# Patient Record
Sex: Male | Born: 1985 | Race: White | Hispanic: No | Marital: Single | Smoking: Current every day smoker
Health system: Southern US, Community
[De-identification: ages and names within clinical notes are randomized; demographics above are authoritative.]

## PROBLEM LIST (undated history)

## (undated) DIAGNOSIS — G47419 Narcolepsy without cataplexy: Secondary | ICD-10-CM

## (undated) DIAGNOSIS — M8700 Idiopathic aseptic necrosis of unspecified bone: Secondary | ICD-10-CM

## (undated) HISTORY — PX: TOTAL HIP ARTHROPLASTY: SHX124

## (undated) HISTORY — PX: JOINT REPLACEMENT: SHX530

---

## 2013-06-01 ENCOUNTER — Other Ambulatory Visit: Payer: Self-pay | Admitting: Neurology

## 2013-06-01 DIAGNOSIS — N329 Bladder disorder, unspecified: Secondary | ICD-10-CM

## 2013-06-01 DIAGNOSIS — H532 Diplopia: Secondary | ICD-10-CM

## 2013-06-05 ENCOUNTER — Other Ambulatory Visit: Payer: Self-pay

## 2020-02-07 ENCOUNTER — Other Ambulatory Visit: Payer: Self-pay

## 2020-02-07 ENCOUNTER — Emergency Department (HOSPITAL_BASED_OUTPATIENT_CLINIC_OR_DEPARTMENT_OTHER)
Admission: EM | Admit: 2020-02-07 | Discharge: 2020-02-07 | Disposition: A | Discharge status: Home / Self Care | Payer: Self-pay | Attending: Emergency Medicine | Admitting: Emergency Medicine

## 2020-02-07 ENCOUNTER — Emergency Department (HOSPITAL_BASED_OUTPATIENT_CLINIC_OR_DEPARTMENT_OTHER): Payer: Self-pay

## 2020-02-07 ENCOUNTER — Encounter (HOSPITAL_BASED_OUTPATIENT_CLINIC_OR_DEPARTMENT_OTHER): Payer: Self-pay | Admitting: Emergency Medicine

## 2020-02-07 DIAGNOSIS — R002 Palpitations: Secondary | ICD-10-CM

## 2020-02-07 DIAGNOSIS — Z20822 Contact with and (suspected) exposure to covid-19: Secondary | ICD-10-CM | POA: Insufficient documentation

## 2020-02-07 DIAGNOSIS — J069 Acute upper respiratory infection, unspecified: Secondary | ICD-10-CM | POA: Insufficient documentation

## 2020-02-07 DIAGNOSIS — Z96643 Presence of artificial hip joint, bilateral: Secondary | ICD-10-CM | POA: Insufficient documentation

## 2020-02-07 DIAGNOSIS — F17210 Nicotine dependence, cigarettes, uncomplicated: Secondary | ICD-10-CM | POA: Insufficient documentation

## 2020-02-07 HISTORY — DX: Narcolepsy without cataplexy: G47.419

## 2020-02-07 HISTORY — DX: Idiopathic aseptic necrosis of unspecified bone: M87.00

## 2020-02-07 LAB — BASIC METABOLIC PANEL
Anion gap: 12 (ref 5–15)
BUN: 8 mg/dL (ref 6–20)
CO2: 25 mmol/L (ref 22–32)
Calcium: 9.3 mg/dL (ref 8.9–10.3)
Chloride: 100 mmol/L (ref 98–111)
Creatinine, Ser: 1.11 mg/dL (ref 0.61–1.24)
GFR calc Af Amer: >60 mL/min (ref >60)
GFR calc non Af Amer: >60 mL/min (ref >60)
Glucose, Bld: 84 mg/dL (ref 70–99)
Potassium: 4.3 mmol/L (ref 3.5–5.1)
Sodium: 137 mmol/L (ref 135–145)

## 2020-02-07 LAB — CBC WITH DIFFERENTIAL/PLATELET
Abs Immature Granulocytes: 0.02 10*3/uL (ref 0.00–0.07)
Basophils Absolute: 0.1 10*3/uL (ref 0.0–0.1)
Basophils Relative: 1 %
Eosinophils Absolute: 0 10*3/uL (ref 0.0–0.5)
Eosinophils Relative: 0 %
HCT: 44.3 % (ref 39.0–52.0)
Hemoglobin: 15 g/dL (ref 13.0–17.0)
Immature Granulocytes: 0 %
Lymphocytes Relative: 33 %
Lymphs Abs: 2.7 10*3/uL (ref 0.7–4.0)
MCH: 29.4 pg (ref 26.0–34.0)
MCHC: 33.9 g/dL (ref 30.0–36.0)
MCV: 86.7 fL (ref 80.0–100.0)
Monocytes Absolute: 0.5 10*3/uL (ref 0.1–1.0)
Monocytes Relative: 6 %
Neutro Abs: 4.9 10*3/uL (ref 1.7–7.7)
Neutrophils Relative %: 60 %
Platelets: 272 10*3/uL (ref 150–400)
RBC: 5.11 MIL/uL (ref 4.22–5.81)
RDW: 12.2 % (ref 11.5–15.5)
WBC: 8.1 10*3/uL (ref 4.0–10.5)
nRBC: 0 % (ref 0.0–0.2)

## 2020-02-07 LAB — SARS CORONAVIRUS 2 (TAT 6-24 HRS): SARS Coronavirus 2: NEGATIVE

## 2020-02-07 LAB — SARS CORONAVIRUS 2 AG (30 MIN TAT): SARS Coronavirus 2 Ag: NEGATIVE

## 2020-02-07 MED ORDER — ACETAMINOPHEN 500 MG PO TABS
1000.0000 mg | ORAL_TABLET | Freq: Once | ORAL | Status: AC
  Administered 2020-02-07: 1000 mg via ORAL
  Filled 2020-02-07: qty 2

## 2020-02-07 MED ORDER — KETOROLAC TROMETHAMINE 15 MG/ML IJ SOLN
15.0000 mg | Freq: Once | INTRAMUSCULAR | Status: AC
Start: 2020-02-07 — End: 2020-02-07
  Administered 2020-02-07: 15 mg via INTRAVENOUS
  Filled 2020-02-07: qty 1

## 2020-02-07 MED ORDER — HYDROXYZINE HCL 25 MG PO TABS
25.0000 mg | ORAL_TABLET | Freq: Once | ORAL | Status: AC
  Administered 2020-02-07: 25 mg via ORAL
  Filled 2020-02-07: qty 1

## 2020-02-07 MED ORDER — HYDROXYZINE HCL 25 MG PO TABS: 25.0000 mg | ORAL_TABLET | Freq: Four times a day (QID) | ORAL | 0 refills | Status: DC | PRN

## 2020-02-07 NOTE — ED Notes (Signed)
Water provided to pt for po challenge

## 2020-02-07 NOTE — ED Provider Notes (Addendum)
[facility] EMERGENCY DEPARTMENT Provider Note   CSN: [id number] Arrival date & time: 02/07/20  1124     History Chief Complaint  Patient presents with  . Cough    William Quinn is a 34 y.o. male.  Patient is a 34 year old male who presents with URI symptoms.  He states he has felt bad for about a week and a half with cough, runny nose, headaches and diarrhea.  He has not had any known fevers but he has had some chills.  He was having some diarrhea but this has resolved.  He does have some shortness of breath.  No vomiting.  He feels like his heart is racing at times.  He occasionally uses some over-the-counter medicines.  He last took Tylenol yesterday but no other medications today.        Past Medical History:  Diagnosis Date  . Avascular necrosis (HCC)   . Narcolepsy     There are no problems to display for this patient.   Past Surgical History:  Procedure Laterality Date  . JOINT REPLACEMENT    . TOTAL HIP ARTHROPLASTY Bilateral        No family history on file.  Social History   Tobacco Use  . Smoking status: Current Every Day Smoker    Types: Cigarettes  . Smokeless tobacco: Never Used  Substance Use Topics  . Alcohol use: Not Currently  . Drug use: Not Currently    Home Medications Prior to Admission medications   Not on File    Allergies    Patient has no known allergies.  Review of Systems   Review of Systems  Constitutional: Positive for appetite change, chills and fatigue. Negative for diaphoresis and fever.  HENT: Positive for congestion and rhinorrhea. Negative for sneezing.   Eyes: Negative.   Respiratory: Positive for cough and shortness of breath. Negative for chest tightness.   Cardiovascular: Positive for palpitations. Negative for chest pain and leg swelling.  Gastrointestinal: Positive for diarrhea and nausea. Negative for abdominal pain, blood in stool and vomiting.  Genitourinary: Negative for difficulty urinating,  flank pain, frequency and hematuria.  Musculoskeletal: Positive for myalgias. Negative for arthralgias and back pain.  Skin: Negative for rash.  Neurological: Positive for light-headedness and headaches. Negative for dizziness, speech difficulty, weakness and numbness.    Physical Exam Updated Vital Signs BP 115/82   Pulse 78   Temp 98.1 F (36.7 C) (Oral)   Resp 16   Ht 5\' 8"  (1.727 m)   Wt 68 kg   SpO2 96%   BMI 22.81 kg/m   Physical Exam Constitutional:      Appearance: He is well-developed.  HENT:     Head: Normocephalic and atraumatic.     Mouth/Throat:     Mouth: Mucous membranes are moist.  Eyes:     Pupils: Pupils are equal, round, and reactive to light.  Cardiovascular:     Rate and Rhythm: Regular rhythm. Tachycardia present.     Heart sounds: Normal heart sounds.  Pulmonary:     Effort: Pulmonary effort is normal. No respiratory distress.     Breath sounds: Normal breath sounds. No wheezing or rales.  Chest:     Chest wall: No tenderness.  Abdominal:     General: Bowel sounds are normal.     Palpations: Abdomen is soft.     Tenderness: There is no abdominal tenderness. There is no guarding or rebound.  Musculoskeletal:        General:  Normal range of motion.     Cervical back: Normal range of motion and neck supple.  Lymphadenopathy:     Cervical: No cervical adenopathy.  Skin:    General: Skin is warm and dry.     Findings: No rash.  Neurological:     Mental Status: He is alert and oriented to person, place, and time.     ED Results / Procedures / Treatments   Labs (all labs ordered are listed, but only abnormal results are displayed) Labs Reviewed  SARS CORONAVIRUS 2 AG (30 MIN TAT)  SARS CORONAVIRUS 2 (TAT 6-24 HRS)    EKG EKG Interpretation  Date/Time:  Sunday February 07 2020 11:33:31 EDT Ventricular Rate:  111 PR Interval:    QRS Duration: 90 QT Interval:  317 QTC Calculation: 431 R Axis:   89 Text Interpretation: Age not entered,  assumed to be  34 years old for purpose of ECG interpretation Sinus tachycardia Ventricular premature complex RAE, consider biatrial enlargement No old tracing to compare Confirmed by Malvin Johns [phone]) on 02/07/2020 12:04:11 PM   Radiology DG Chest Port 1 View  Result Date: 02/07/2020 CLINICAL DATA:  Cough congestion, fever and shortness of breath. EXAM: PORTABLE CHEST 1 VIEW COMPARISON:  None. FINDINGS: Cardiomediastinal silhouette is normal. Mediastinal contours appear intact. There is no evidence of focal airspace consolidation, pleural effusion or pneumothorax. Osseous structures are without acute abnormality. Soft tissues are grossly normal. IMPRESSION: No active disease. Electronically Signed   By: Fidela Salisbury M.D.   On: 02/07/2020 12:21    Procedures Procedures (including critical care time)  Medications Ordered in ED Medications  acetaminophen (TYLENOL) tablet 1,000 mg (1,000 mg Oral Given 02/07/20 1241)  ketorolac (TORADOL) 15 MG/ML injection 15 mg (15 mg Intravenous Given 02/07/20 1247)    ED Course  I have reviewed the triage vital signs and the nursing notes.  Pertinent labs & imaging results that were available during my care of the patient were reviewed by me and considered in my medical decision making (see chart for details).    MDM Rules/Calculators/A&P                      Patient is a 34 year old male who presents with viral symptoms including congestion, coughing, headaches and myalgias. He has no ongoing vomiting or diarrhea. He does not appear dehydrated. He did have some tachycardia on arrival which has resolved in the ED. He was given Tylenol and some Toradol for his headache and achiness. He was not febrile here. His heart rate at rest normalizes to the 70s and 80s. When he moves around, it goes up to the 90s and low 100s. He was concerned about his heart rate. I feel this is likely relating to his viral syndrome. I did offer to give him a small amount of IV  fluids even though we try to avoid using Covid patients. His rapid Covid was negative although he does have symptoms that are concerning for Covid. At this point he does not want to get IV fluids and wants to drink fluids by mouth. I feel like this is appropriate. He is otherwise well-appearing. He will be discharged home. He is in good condition. He was given symptomatic care instructions and return precautions.  Heartrate still mildly elevated on movement, normal at rest.  Likely associated with viral syndrome.  No hypoxia.  Will check labs, reassess.  Dr. Ralene Bathe to follow.  Final Clinical Impression(s) / ED Diagnoses Final diagnoses:  Viral URI with cough    Rx / DC Orders ED Discharge Orders    None       Rolan Bucco, MD 02/07/20 1417    Rolan Bucco, MD 02/07/20 [phone]

## 2020-02-07 NOTE — ED Notes (Signed)
O2 sat 98% while ambulating.

## 2020-02-07 NOTE — ED Notes (Signed)
SOB and CP stated when asked what brings PT in. EKG obtained and vitals within normal limits.

## 2020-02-07 NOTE — ED Triage Notes (Signed)
Pt to ED via [facility] with c/o SHOB, HA, prod cough, heart racing, sweats x 1 wk

## 2020-02-07 NOTE — ED Notes (Signed)
Pt sts he does not know what pharmacy he would use

## 2020-02-07 NOTE — ED Provider Notes (Signed)
Patient care assumed at 1500.  Patient with no significant past medical hx here for evaluation of racing heart rate, cough, headache.  He is nontoxic appearing on exam, heart rate of 80 on the monitor.  He is mildly anxious. He has no respiratory distress.  Imaging negative for pna.  Presentation is not c/w PE - pt without risk factors.  Doubt ACS, dissection.  Discussed with patient possible covid 19 infection as well as possible anxiety component.  Discussed decreasing caffeine intake, tobacco intake.  He denies alcohol or street drugs.  Discussed outpatient follow up.     Tilden Fossa, MD 02/07/20 [phone]

## 2020-08-15 ENCOUNTER — Other Ambulatory Visit: Payer: Self-pay

## 2020-08-15 ENCOUNTER — Emergency Department (HOSPITAL_BASED_OUTPATIENT_CLINIC_OR_DEPARTMENT_OTHER)
Admission: EM | Admit: 2020-08-15 | Discharge: 2020-08-16 | Disposition: A | Discharge status: Home / Self Care | Payer: Self-pay | Attending: Emergency Medicine | Admitting: Emergency Medicine

## 2020-08-15 ENCOUNTER — Encounter (HOSPITAL_BASED_OUTPATIENT_CLINIC_OR_DEPARTMENT_OTHER): Payer: Self-pay | Admitting: *Deleted

## 2020-08-15 DIAGNOSIS — Z96643 Presence of artificial hip joint, bilateral: Secondary | ICD-10-CM | POA: Insufficient documentation

## 2020-08-15 DIAGNOSIS — F17210 Nicotine dependence, cigarettes, uncomplicated: Secondary | ICD-10-CM | POA: Insufficient documentation

## 2020-08-15 DIAGNOSIS — E119 Type 2 diabetes mellitus without complications: Secondary | ICD-10-CM

## 2020-08-15 DIAGNOSIS — T50901A Poisoning by unspecified drugs, medicaments and biological substances, accidental (unintentional), initial encounter: Secondary | ICD-10-CM | POA: Insufficient documentation

## 2020-08-15 LAB — RAPID URINE DRUG SCREEN, HOSP PERFORMED
Amphetamines: NOT DETECTED
Barbiturates: NOT DETECTED
Benzodiazepines: NOT DETECTED
Cocaine: NOT DETECTED
Opiates: NOT DETECTED
Tetrahydrocannabinol: NOT DETECTED

## 2020-08-15 NOTE — ED Notes (Signed)
Upon arrival, GCS obtained, baseline vitals, placed on cont cardiac monitor, 12 lead ECG obtained, IV established

## 2020-08-15 NOTE — ED Triage Notes (Signed)
Per EMS pt had OD, rec Narcan intra nasally and IV, pt ambulated to Room 1 in [facility] ED very alert and oriented, very cooperative.

## 2020-08-16 ENCOUNTER — Encounter (HOSPITAL_BASED_OUTPATIENT_CLINIC_OR_DEPARTMENT_OTHER): Payer: Self-pay | Admitting: Emergency Medicine

## 2020-08-16 LAB — CBC WITH DIFFERENTIAL/PLATELET
Abs Immature Granulocytes: 0.17 10*3/uL — ABNORMAL HIGH (ref 0.00–0.07)
Basophils Absolute: 0.1 10*3/uL (ref 0.0–0.1)
Basophils Relative: 1 %
Eosinophils Absolute: 0.1 10*3/uL (ref 0.0–0.5)
Eosinophils Relative: 1 %
HCT: 37.9 % — ABNORMAL LOW (ref 39.0–52.0)
Hemoglobin: 12.9 g/dL — ABNORMAL LOW (ref 13.0–17.0)
Immature Granulocytes: 2 %
Lymphocytes Relative: 42 %
Lymphs Abs: 4.6 10*3/uL — ABNORMAL HIGH (ref 0.7–4.0)
MCH: 28.7 pg (ref 26.0–34.0)
MCHC: 34 g/dL (ref 30.0–36.0)
MCV: 84.4 fL (ref 80.0–100.0)
Monocytes Absolute: 0.7 10*3/uL (ref 0.1–1.0)
Monocytes Relative: 6 %
Neutro Abs: 5.4 10*3/uL (ref 1.7–7.7)
Neutrophils Relative %: 48 %
Platelets: 306 10*3/uL (ref 150–400)
RBC: 4.49 MIL/uL (ref 4.22–5.81)
RDW: 12.6 % (ref 11.5–15.5)
WBC: 11 10*3/uL — ABNORMAL HIGH (ref 4.0–10.5)
nRBC: 0 % (ref 0.0–0.2)

## 2020-08-16 LAB — COMPREHENSIVE METABOLIC PANEL
ALT: 9 U/L (ref 0–44)
AST: 23 U/L (ref 15–41)
Albumin: 3.6 g/dL (ref 3.5–5.0)
Alkaline Phosphatase: 98 U/L (ref 38–126)
Anion gap: 12 (ref 5–15)
BUN: <5 mg/dL — ABNORMAL LOW (ref 6–20)
CO2: 22 mmol/L (ref 22–32)
Calcium: 8 mg/dL — ABNORMAL LOW (ref 8.9–10.3)
Chloride: 99 mmol/L (ref 98–111)
Creatinine, Ser: 1.2 mg/dL (ref 0.61–1.24)
GFR, Estimated: >60 mL/min (ref >60)
Glucose, Bld: 220 mg/dL — ABNORMAL HIGH (ref 70–99)
Potassium: 3.1 mmol/L — ABNORMAL LOW (ref 3.5–5.1)
Sodium: 133 mmol/L — ABNORMAL LOW (ref 135–145)
Total Bilirubin: 0.6 mg/dL (ref 0.3–1.2)
Total Protein: 6.7 g/dL (ref 6.5–8.1)

## 2020-08-16 LAB — SALICYLATE LEVEL: Salicylate Lvl: <7 mg/dL — ABNORMAL LOW (ref 7.0–30.0)

## 2020-08-16 LAB — ACETAMINOPHEN LEVEL: Acetaminophen (Tylenol), Serum: <10 ug/mL — ABNORMAL LOW (ref 10–30)

## 2020-08-16 MED ORDER — POTASSIUM CHLORIDE CRYS ER 20 MEQ PO TBCR
40.0000 meq | EXTENDED_RELEASE_TABLET | Freq: Once | ORAL | Status: AC
  Administered 2020-08-16: 40 meq via ORAL
  Filled 2020-08-16: qty 2

## 2020-08-16 MED ORDER — METFORMIN HCL 500 MG PO TABS: 500.0000 mg | ORAL_TABLET | Freq: Two times a day (BID) | ORAL | 0 refills | Status: DC

## 2020-08-16 NOTE — ED Provider Notes (Signed)
[facility] EMERGENCY DEPARTMENT Provider Note   CSN: [id number] Arrival date & time: 08/15/20  2257     History Chief Complaint  Patient presents with  . OD    William Quinn is a 34 y.o. male.  The history is provided by the patient.  Drug Overdose This is a new problem. The current episode started less than 1 hour ago. The problem occurs constantly. The problem has been resolved. Pertinent negatives include no chest pain, no abdominal pain, no headaches and no shortness of breath. Nothing aggravates the symptoms. Nothing relieves the symptoms. Treatments tried: narcan  The treatment provided significant relief.  Patient reports being giving a "powder" for a headache and then being unresponsives.  EMS reported narcan caused patient to awake immediately.  No SI no HI.  States this was not intentional.  Awake without complaints at this time.      Past Medical History:  Diagnosis Date  . Avascular necrosis (HCC)   . Narcolepsy     There are no problems to display for this patient.   Past Surgical History:  Procedure Laterality Date  . JOINT REPLACEMENT    . TOTAL HIP ARTHROPLASTY Bilateral        History reviewed. No pertinent family history.  Social History   Tobacco Use  . Smoking status: Current Every Day Smoker    Types: Cigarettes  . Smokeless tobacco: Never Used  Vaping Use  . Vaping Use: Never used  Substance Use Topics  . Alcohol use: Not Currently  . Drug use: Not Currently    Home Medications Prior to Admission medications   Medication Sig Start Date End Date Taking? Authorizing Provider  hydrOXYzine (ATARAX/VISTARIL) 25 MG tablet Take 1 tablet (25 mg total) by mouth every 6 (six) hours as needed. 02/07/20   Tilden Fossa, MD    Allergies    Patient has no known allergies.  Review of Systems   Review of Systems  Constitutional: Negative for fever.  HENT: Negative for congestion.   Eyes: Negative for visual disturbance.    Respiratory: Negative for shortness of breath.   Cardiovascular: Negative for chest pain.  Gastrointestinal: Negative for abdominal pain.  Genitourinary: Negative for difficulty urinating.  Musculoskeletal: Negative for arthralgias.  Skin: Negative for rash.  Neurological: Negative for headaches.  Psychiatric/Behavioral: Negative for self-injury and suicidal ideas. The patient is not nervous/anxious.   All other systems reviewed and are negative.   Physical Exam Updated Vital Signs BP 132/84 (BP Location: Left Arm)   Pulse 98   Resp 18   Ht 5\' 8"  (1.727 m)   Wt 68 kg   SpO2 100%   BMI 22.81 kg/m   Physical Exam Vitals and nursing note reviewed.  Constitutional:      General: He is not in acute distress.    Appearance: Normal appearance.  HENT:     Head: Normocephalic and atraumatic.     Nose: Nose normal.  Eyes:     Conjunctiva/sclera: Conjunctivae normal.     Pupils: Pupils are equal, round, and reactive to light.  Cardiovascular:     Rate and Rhythm: Normal rate and regular rhythm.     Pulses: Normal pulses.     Heart sounds: Normal heart sounds.  Pulmonary:     Effort: Pulmonary effort is normal.     Breath sounds: Normal breath sounds.  Abdominal:     General: Abdomen is flat. Bowel sounds are normal.     Palpations: Abdomen is soft.  Tenderness: There is no abdominal tenderness. There is no guarding or rebound.  Musculoskeletal:        General: Normal range of motion.     Cervical back: Normal range of motion and neck supple.  Skin:    General: Skin is warm and dry.     Capillary Refill: Capillary refill takes less than 2 seconds.  Neurological:     General: No focal deficit present.     Mental Status: He is alert and oriented to person, place, and time.     Deep Tendon Reflexes: Reflexes normal.  Psychiatric:        Mood and Affect: Mood normal.        Behavior: Behavior normal.        Thought Content: Thought content does not include homicidal or  suicidal ideation.     ED Results / Procedures / Treatments   Labs (all labs ordered are listed, but only abnormal results are displayed) Results for orders placed or performed during the hospital encounter of 08/15/20  Rapid urine drug screen (hospital performed)  Result Value Ref Range   Opiates NONE DETECTED NONE DETECTED   Cocaine NONE DETECTED NONE DETECTED   Benzodiazepines NONE DETECTED NONE DETECTED   Amphetamines NONE DETECTED NONE DETECTED   Tetrahydrocannabinol NONE DETECTED NONE DETECTED   Barbiturates NONE DETECTED NONE DETECTED  CBC with Differential/Platelet  Result Value Ref Range   WBC 11.0 (H) 4.0 - 10.5 K/uL   RBC 4.49 4.22 - 5.81 MIL/uL   Hemoglobin 12.9 (L) 13.0 - 17.0 g/dL   HCT 93.7 (L) 39 - 52 %   MCV 84.4 80.0 - 100.0 fL   MCH 28.7 26.0 - 34.0 pg   MCHC 34.0 30.0 - 36.0 g/dL   RDW 90.2 40.9 - 73.5 %   Platelets 306 150 - 400 K/uL   nRBC 0.0 0.0 - 0.2 %   Neutrophils Relative % 48 %   Neutro Abs 5.4 1.7 - 7.7 K/uL   Lymphocytes Relative 42 %   Lymphs Abs 4.6 (H) 0.7 - 4.0 K/uL   Monocytes Relative 6 %   Monocytes Absolute 0.7 0.1 - 1.0 K/uL   Eosinophils Relative 1 %   Eosinophils Absolute 0.1 0 - 0 K/uL   Basophils Relative 1 %   Basophils Absolute 0.1 0 - 0 K/uL   Immature Granulocytes 2 %   Abs Immature Granulocytes 0.17 (H) 0.00 - 0.07 K/uL  Comprehensive metabolic panel  Result Value Ref Range   Sodium 133 (L) 135 - 145 mmol/L   Potassium 3.1 (L) 3.5 - 5.1 mmol/L   Chloride 99 98 - 111 mmol/L   CO2 22 22 - 32 mmol/L   Glucose, Bld 220 (H) 70 - 99 mg/dL   BUN <5 (L) 6 - 20 mg/dL   Creatinine, Ser 3.29 0.61 - 1.24 mg/dL   Calcium 8.0 (L) 8.9 - 10.3 mg/dL   Total Protein 6.7 6.5 - 8.1 g/dL   Albumin 3.6 3.5 - 5.0 g/dL   AST 23 15 - 41 U/L   ALT 9 0 - 44 U/L   Alkaline Phosphatase 98 38 - 126 U/L   Total Bilirubin 0.6 0.3 - 1.2 mg/dL   GFR, Estimated >92 >42 mL/min   Anion gap 12 5 - 15  Acetaminophen level  Result Value Ref Range    Acetaminophen (Tylenol), Serum <10 (L) 10 - 30 ug/mL  Salicylate level  Result Value Ref Range   Salicylate Lvl <7.0 (L) 7.0 -  30.0 mg/dL   No results found.  EKG EKG Interpretation  Date/Time:  Monday August 15 2020 23:01:01 EDT Ventricular Rate:  88 PR Interval:    QRS Duration: 104 QT Interval:  389 QTC Calculation: 471 R Axis:   88 Text Interpretation: Sinus rhythm Baseline wander in lead(s) V3 V4 Confirmed by Nicanor Alcon, Siennah Barrasso ([id number]) on 08/15/2020 11:54:56 PM   Radiology No results found.  Procedures Procedures (including critical care time)  Medications Ordered in ED Medications  potassium chloride SA (KLOR-CON) CR tablet 40 mEq (40 mEq Oral Given 08/16/20 0041)    ED Course  I have reviewed the triage vital signs and the nursing notes.  Pertinent labs & imaging results that were available during my care of the patient were reviewed by me and considered in my medical decision making (see chart for details).    Awake and alert.  No SI.  I suspect this was a synthetic opioid that I cannot test for given the response to narcan.  Patient was observed in the ED.  Patient is now a diabetic based on ADA classification.  I will start low dose metformin and refer to PMD for A1c and ongoing care.  I have advised the patient not to take drugs from people.  Patient verbalizes understanding and agrees to follow up.  William Quinn was evaluated in Emergency Department on 08/16/2020 for the symptoms described in the history of present illness. He was evaluated in the context of the global COVID-19 pandemic, which necessitated consideration that the patient might be at risk for infection with the SARS-CoV-2 virus that causes COVID-19. Institutional protocols and algorithms that pertain to the evaluation of patients at risk for COVID-19 are in a state of rapid change based on information released by regulatory bodies including the CDC and federal and state organizations. These policies and  algorithms were followed during the patient's care in the ED.  Final Clinical Impression(s) / ED Diagnoses  Return for intractable cough, coughing up blood,fevers >100.4 unrelieved by medication, shortness of breath, intractable vomiting, chest pain, shortness of breath, weakness,numbness, changes in speech, facial asymmetry,abdominal pain, passing out,Inability to tolerate liquids or food, cough, altered mental status or any concerns. No signs of systemic illness or infection. The patient is nontoxic-appearing on exam and vital signs are within normal limits.   I have reviewed the triage vital signs and the nursing notes. Pertinent labs &imaging results that were available during my care of the patient were reviewed by me and considered in my medical decision making (see chart for details).After history, exam, and medical workup I feel the patient has beenappropriately medically screened and is safe for discharge home. Pertinent diagnoses were discussed with the patient. Patient was given return precautions.     Mattis Featherly, MD 08/16/20 0126

## 2020-08-16 NOTE — ED Notes (Signed)
Pt tolerating po fluids. No emesis. Alert.

## 2020-08-16 NOTE — ED Notes (Signed)
Lab Staff informed that ED Providers has placed lab test orders.

## 2020-10-15 ENCOUNTER — Observation Stay (HOSPITAL_COMMUNITY): Payer: Self-pay

## 2020-10-15 ENCOUNTER — Inpatient Hospital Stay (HOSPITAL_BASED_OUTPATIENT_CLINIC_OR_DEPARTMENT_OTHER)
Admission: EM | Admit: 2020-10-15 | Discharge: 2020-10-31 | DRG: 682 | Disposition: A | Discharge status: Home / Self Care | Payer: Self-pay | Attending: Internal Medicine | Admitting: Internal Medicine

## 2020-10-15 ENCOUNTER — Encounter (HOSPITAL_BASED_OUTPATIENT_CLINIC_OR_DEPARTMENT_OTHER): Payer: Self-pay | Admitting: Emergency Medicine

## 2020-10-15 ENCOUNTER — Emergency Department (HOSPITAL_BASED_OUTPATIENT_CLINIC_OR_DEPARTMENT_OTHER): Payer: Self-pay

## 2020-10-15 DIAGNOSIS — E872 Acidosis: Secondary | ICD-10-CM | POA: Diagnosis present

## 2020-10-15 DIAGNOSIS — Z96643 Presence of artificial hip joint, bilateral: Secondary | ICD-10-CM | POA: Diagnosis present

## 2020-10-15 DIAGNOSIS — N432 Other hydrocele: Secondary | ICD-10-CM | POA: Diagnosis present

## 2020-10-15 DIAGNOSIS — E860 Dehydration: Secondary | ICD-10-CM | POA: Diagnosis present

## 2020-10-15 DIAGNOSIS — Z7984 Long term (current) use of oral hypoglycemic drugs: Secondary | ICD-10-CM

## 2020-10-15 DIAGNOSIS — E8770 Fluid overload, unspecified: Secondary | ICD-10-CM | POA: Diagnosis not present

## 2020-10-15 DIAGNOSIS — M8700 Idiopathic aseptic necrosis of unspecified bone: Secondary | ICD-10-CM

## 2020-10-15 DIAGNOSIS — T796XXA Traumatic ischemia of muscle, initial encounter: Secondary | ICD-10-CM

## 2020-10-15 DIAGNOSIS — Z20822 Contact with and (suspected) exposure to covid-19: Secondary | ICD-10-CM | POA: Diagnosis present

## 2020-10-15 DIAGNOSIS — M797 Fibromyalgia: Secondary | ICD-10-CM | POA: Diagnosis present

## 2020-10-15 DIAGNOSIS — F17210 Nicotine dependence, cigarettes, uncomplicated: Secondary | ICD-10-CM | POA: Diagnosis present

## 2020-10-15 DIAGNOSIS — Z23 Encounter for immunization: Secondary | ICD-10-CM

## 2020-10-15 DIAGNOSIS — E1165 Type 2 diabetes mellitus with hyperglycemia: Secondary | ICD-10-CM | POA: Diagnosis present

## 2020-10-15 DIAGNOSIS — R0602 Shortness of breath: Secondary | ICD-10-CM

## 2020-10-15 DIAGNOSIS — R202 Paresthesia of skin: Secondary | ICD-10-CM

## 2020-10-15 DIAGNOSIS — E8809 Other disorders of plasma-protein metabolism, not elsewhere classified: Secondary | ICD-10-CM | POA: Diagnosis present

## 2020-10-15 DIAGNOSIS — R29898 Other symptoms and signs involving the musculoskeletal system: Secondary | ICD-10-CM | POA: Diagnosis present

## 2020-10-15 DIAGNOSIS — E871 Hypo-osmolality and hyponatremia: Secondary | ICD-10-CM | POA: Diagnosis present

## 2020-10-15 DIAGNOSIS — N170 Acute kidney failure with tubular necrosis: Principal | ICD-10-CM | POA: Diagnosis present

## 2020-10-15 DIAGNOSIS — M6282 Rhabdomyolysis: Secondary | ICD-10-CM | POA: Diagnosis present

## 2020-10-15 DIAGNOSIS — R52 Pain, unspecified: Secondary | ICD-10-CM

## 2020-10-15 DIAGNOSIS — E875 Hyperkalemia: Secondary | ICD-10-CM | POA: Diagnosis present

## 2020-10-15 DIAGNOSIS — G47419 Narcolepsy without cataplexy: Secondary | ICD-10-CM | POA: Diagnosis present

## 2020-10-15 DIAGNOSIS — K5900 Constipation, unspecified: Secondary | ICD-10-CM | POA: Diagnosis present

## 2020-10-15 DIAGNOSIS — M109 Gout, unspecified: Secondary | ICD-10-CM | POA: Diagnosis present

## 2020-10-15 DIAGNOSIS — J189 Pneumonia, unspecified organism: Secondary | ICD-10-CM | POA: Diagnosis not present

## 2020-10-15 DIAGNOSIS — R509 Fever, unspecified: Secondary | ICD-10-CM

## 2020-10-15 DIAGNOSIS — E119 Type 2 diabetes mellitus without complications: Secondary | ICD-10-CM | POA: Diagnosis present

## 2020-10-15 DIAGNOSIS — N179 Acute kidney failure, unspecified: Secondary | ICD-10-CM

## 2020-10-15 DIAGNOSIS — G894 Chronic pain syndrome: Secondary | ICD-10-CM | POA: Diagnosis present

## 2020-10-15 DIAGNOSIS — R0600 Dyspnea, unspecified: Secondary | ICD-10-CM

## 2020-10-15 LAB — RESP PANEL BY RT-PCR (FLU A&B, COVID) ARPGX2
Influenza A by PCR: NEGATIVE
Influenza B by PCR: NEGATIVE
SARS Coronavirus 2 by RT PCR: NEGATIVE

## 2020-10-15 LAB — GLUCOSE, CAPILLARY
Glucose-Capillary: 78 mg/dL (ref 70–99)
Glucose-Capillary: 103 mg/dL — ABNORMAL HIGH (ref 70–99)

## 2020-10-15 LAB — CREATININE, SERUM
Creatinine, Ser: 4.95 mg/dL — ABNORMAL HIGH (ref 0.61–1.24)
GFR, Estimated: 15 mL/min — ABNORMAL LOW

## 2020-10-15 LAB — CBC
HCT: 34.1 % — ABNORMAL LOW (ref 39.0–52.0)
HCT: 31.7 % — ABNORMAL LOW (ref 39.0–52.0)
Hemoglobin: 12.1 g/dL — ABNORMAL LOW (ref 13.0–17.0)
Hemoglobin: 10.9 g/dL — ABNORMAL LOW (ref 13.0–17.0)
MCH: 30 pg (ref 26.0–34.0)
MCH: 29 pg (ref 26.0–34.0)
MCHC: 35.5 g/dL (ref 30.0–36.0)
MCHC: 34.4 g/dL (ref 30.0–36.0)
MCV: 84.4 fL (ref 80.0–100.0)
MCV: 84.3 fL (ref 80.0–100.0)
Platelets: 171 10*3/uL (ref 150–400)
Platelets: 159 10*3/uL (ref 150–400)
RBC: 4.04 MIL/uL — ABNORMAL LOW (ref 4.22–5.81)
RBC: 3.76 MIL/uL — ABNORMAL LOW (ref 4.22–5.81)
RDW: 13.2 % (ref 11.5–15.5)
RDW: 13.2 % (ref 11.5–15.5)
WBC: 13.2 10*3/uL — ABNORMAL HIGH (ref 4.0–10.5)
WBC: 9.4 10*3/uL (ref 4.0–10.5)
nRBC: 0 % (ref 0.0–0.2)
nRBC: 0 % (ref 0.0–0.2)

## 2020-10-15 LAB — CK: Total CK: >50000 U/L — ABNORMAL HIGH (ref 49–397)

## 2020-10-15 LAB — LIPID PANEL
Cholesterol: 127 mg/dL (ref 0–200)
HDL: 29 mg/dL — ABNORMAL LOW
LDL Cholesterol: 66 mg/dL (ref 0–99)
Total CHOL/HDL Ratio: 4.4 ratio
Triglycerides: 161 mg/dL — ABNORMAL HIGH
VLDL: 32 mg/dL (ref 0–40)

## 2020-10-15 LAB — BASIC METABOLIC PANEL
Anion gap: 9 (ref 5–15)
BUN: 50 mg/dL — ABNORMAL HIGH (ref 6–20)
CO2: 23 mmol/L (ref 22–32)
Calcium: 7.6 mg/dL — ABNORMAL LOW (ref 8.9–10.3)
Chloride: 91 mmol/L — ABNORMAL LOW (ref 98–111)
Creatinine, Ser: 3.86 mg/dL — ABNORMAL HIGH (ref 0.61–1.24)
GFR, Estimated: 20 mL/min — ABNORMAL LOW (ref >60)
Glucose, Bld: 99 mg/dL (ref 70–99)
Potassium: 4.1 mmol/L (ref 3.5–5.1)
Sodium: 123 mmol/L — ABNORMAL LOW (ref 135–145)

## 2020-10-15 MED ORDER — TRAMADOL HCL 50 MG PO TABS
50.0000 mg | ORAL_TABLET | Freq: Four times a day (QID) | ORAL | Status: DC | PRN
  Administered 2020-10-15 – 2020-10-31 (×33): 50 mg via ORAL
  Filled 2020-10-15 (×34): qty 1

## 2020-10-15 MED ORDER — PNEUMOCOCCAL VAC POLYVALENT 25 MCG/0.5ML IJ INJ
0.5000 mL | INJECTION | INTRAMUSCULAR | Status: DC
  Filled 2020-10-15: qty 0.5

## 2020-10-15 MED ORDER — SODIUM CHLORIDE 0.9 % IV SOLN: INTRAVENOUS | Status: DC

## 2020-10-15 MED ORDER — HEPARIN SODIUM (PORCINE) 5000 UNIT/ML IJ SOLN
5000.0000 [IU] | Freq: Three times a day (TID) | INTRAMUSCULAR | Status: DC
  Administered 2020-10-15 – 2020-10-31 (×43): 5000 [IU] via SUBCUTANEOUS
  Filled 2020-10-15 (×43): qty 1

## 2020-10-15 MED ORDER — INSULIN ASPART 100 UNIT/ML ~~LOC~~ SOLN
0.0000 [IU] | SUBCUTANEOUS | Status: DC
  Administered 2020-10-16 (×2): 2 [IU] via SUBCUTANEOUS
  Administered 2020-10-16: 13:00:00 3 [IU] via SUBCUTANEOUS

## 2020-10-15 MED ORDER — METHYLPREDNISOLONE SODIUM SUCC 125 MG IJ SOLR
60.0000 mg | Freq: Two times a day (BID) | INTRAMUSCULAR | Status: DC
  Administered 2020-10-15 – 2020-10-16 (×2): 60 mg via INTRAVENOUS
  Filled 2020-10-15 (×2): qty 2

## 2020-10-15 MED ORDER — SODIUM CHLORIDE 0.9 % IV SOLN
1000.0000 mL | INTRAVENOUS | Status: DC
  Administered 2020-10-15: 1000 mL via INTRAVENOUS

## 2020-10-15 MED ORDER — INFLUENZA VAC SPLIT QUAD 0.5 ML IM SUSY: 0.5000 mL | PREFILLED_SYRINGE | INTRAMUSCULAR | Status: DC

## 2020-10-15 MED ORDER — SODIUM CHLORIDE 0.9 % IV BOLUS (SEPSIS)
500.0000 mL | Freq: Once | INTRAVENOUS | Status: AC
  Administered 2020-10-15: 500 mL via INTRAVENOUS

## 2020-10-15 MED ORDER — ACETAMINOPHEN 325 MG PO TABS
650.0000 mg | ORAL_TABLET | Freq: Four times a day (QID) | ORAL | Status: DC | PRN
  Administered 2020-10-15 – 2020-10-28 (×13): 650 mg via ORAL
  Filled 2020-10-15 (×13): qty 2

## 2020-10-15 MED ORDER — SODIUM CHLORIDE 0.9 % IV BOLUS
1000.0000 mL | Freq: Once | INTRAVENOUS | Status: AC
  Administered 2020-10-15: 19:00:00 1000 mL via INTRAVENOUS

## 2020-10-15 NOTE — ED Notes (Signed)
82ml on bladder scan

## 2020-10-15 NOTE — Progress Notes (Signed)
Critical lab value CK>50,000 reported to Dr. Carolyne Littles.

## 2020-10-15 NOTE — ED Notes (Signed)
Patient transported to Ultrasound 

## 2020-10-15 NOTE — H&P (Signed)
[facility] History and Physical  Ibrohim Simmers IFO:[id number] DOB: June 27, 1986 DOA: 10/15/2020  Referring physician:  PCP: Patient, No Pcp Per   Chief Complaint: MCHP  HPI: Stclair Szymborski is a 34 yo WM  PMHx Avascular necrosis, Narcolepsy, DM type II  presents to the ED for evaluation of numbness of his right leg.  Patient states the symptoms started yesterday.  He feels like his whole right leg is having pins-and-needles.  Its not on one side versus the other.  Patient also has some mild discomfort with it but is not really having any significant pain.  He denies any back pain.  Patient states his foot also feels cold  Patient's numbness is in a stocking type pattern.  Argues against lumbar radiculopathy however the unilateral nature is atypical for some type of metabolic cause for his numbness.  Did check laboratory test to assess for any electrolyte abnormalities and the patient does have evidence of acute kidney injury with elevated BUN and creatinine.  Patient is also hyponatremic and this is all new from 2 months ago.  I have ordered a renal ultrasound.  I will also add on a CK.  Patient states that yesterday he fell on a tile floor fairly hard.  Then began to experience weakness and numbness in his RIGHT lower extremity.    Review of Systems:  Covid vaccination; vaccinated  Constitutional:  No weight loss, night sweats, Fevers, chills, fatigue.  HEENT:  No headaches, Difficulty swallowing,Tooth/dental problems,Sore throat,  No sneezing, itching, ear ache, nasal congestion, post nasal drip,  Cardio-vascular:  No chest pain, Orthopnea, PND, swelling in lower extremities, anasarca, dizziness, palpitations  GI:  No heartburn, indigestion, abdominal pain, nausea, vomiting, diarrhea, change in bowel habits, loss of appetite  Resp:  No shortness of breath with exertion or at rest. No excess mucus, no productive cough, No non-productive cough, No coughing up of blood.No change in  color of mucus.No wheezing.No chest wall deformity  Skin:  Positive bruise right L5-S1 area.  GU:  no dysuria, change in color of urine, no urgency or frequency. No flank pain.  Musculoskeletal:  No joint pain or swelling. No decreased range of motion. No back pain.  Psych:  No change in mood or affect. No depression or anxiety. No memory loss. Neurologic; Positive RIGHT lower extremity weakness, positive RIGHT lower extremity decreased sensation  Past Medical History:  Diagnosis Date  . Avascular necrosis (HCC)   . Narcolepsy    Past Surgical History:  Procedure Laterality Date  . JOINT REPLACEMENT    . TOTAL HIP ARTHROPLASTY Bilateral    Social History:  reports that he has been smoking cigarettes. He has never used smokeless tobacco. He reports previous alcohol use. He reports previous drug use.  No Known Allergies  FHx negative HTN, negative DM, negative stroke, negative cancer  Prior to Admission medications   Medication Sig Start Date End Date Taking? Authorizing Provider  hydrOXYzine (ATARAX/VISTARIL) 25 MG tablet Take 1 tablet (25 mg total) by mouth every 6 (six) hours as needed. 02/07/20   Tilden Fossa, MD  metFORMIN (GLUCOPHAGE) 500 MG tablet Take 1 tablet (500 mg total) by mouth 2 (two) times daily with a meal. 08/16/20   Palumbo, April, MD     Consultants:    Procedures/Significant Events:    I have personally reviewed and interpreted all radiology studies and my findings are as above.   VENTILATOR SETTINGS:    Cultures 12/11 negative SARS coronavirus 12/11 influenza A/B negative  Antimicrobials:  Devices    LINES / TUBES:      Continuous Infusions: . sodium chloride 1,000 mL (10/15/20 1533)  . sodium chloride    . sodium chloride      Physical Exam: Vitals:   10/15/20 1500 10/15/20 1557 10/15/20 1600 10/15/20 1716  BP: 124/85  126/80 130/70  Pulse: 73  75 70  Resp: 18  16 18   Temp:  98.9 F (37.2 C)  99.1 F (37.3 C)   TempSrc:  Oral  Oral  SpO2: 100%  98% 99%  Weight:      Height:        Wt Readings from Last 3 Encounters:  10/15/20 65.8 kg  08/15/20 68 kg  02/07/20 68 kg    General: A/O x4, No acute respiratory distress Eyes: negative scleral hemorrhage, negative anisocoria, negative icterus ENT: Negative Runny nose, negative gingival bleeding, Neck:  Negative scars, masses, torticollis, lymphadenopathy, JVD Lungs: Clear to auscultation bilaterally without wheezes or crackles Cardiovascular: Regular rate and rhythm without murmur gallop or rub normal S1 and S2 Abdomen: negative abdominal pain, nondistended, positive soft, bowel sounds, no rebound, no ascites, no appreciable mass Extremities: No significant cyanosis, clubbing, or edema bilateral lower extremities Skin: Bruise over the RIGHT L4-S1 area. Psychiatric:  Negative depression, negative anxiety, negative fatigue, negative mania  Central nervous system:  Cranial nerves II through XII intact, tongue/uvula midline, all extremities muscle strength 5/5 except RIGHT lower extremity.  RIGHT lower extremity strength 4/5, sensation intact throughout, except RIGHT lower extremity.  RIGHT lower extremity decreased sensation to pinprick and soft touch from hip to toes.  negative dysarthria, negative expressive aphasia, negative receptive aphasia.        Labs on Admission:  Basic Metabolic Panel: Recent Labs  Lab 10/15/20 1325  NA 123*  K 4.1  CL 91*  CO2 23  GLUCOSE 99  BUN 50*  CREATININE 3.86*  CALCIUM 7.6*   Liver Function Tests: No results for input(s): AST, ALT, ALKPHOS, BILITOT, PROT, ALBUMIN in the last 168 hours. No results for input(s): LIPASE, AMYLASE in the last 168 hours. No results for input(s): AMMONIA in the last 168 hours. CBC: Recent Labs  Lab 10/15/20 1325  WBC 13.2*  HGB 12.1*  HCT 34.1*  MCV 84.4  PLT 171   Cardiac Enzymes: Recent Labs  Lab 10/15/20 1325  CKTOTAL >50,000*    BNP (last 3 results) No  results for input(s): BNP in the last 8760 hours.  ProBNP (last 3 results) No results for input(s): PROBNP in the last 8760 hours.  CBG: No results for input(s): GLUCAP in the last 168 hours.  Radiological Exams on Admission: 14/11/21 Renal  Result Date: 10/15/2020 CLINICAL DATA:  Acute renal failure. EXAM: RENAL / URINARY TRACT ULTRASOUND COMPLETE COMPARISON:  None. FINDINGS: Right Kidney: Renal measurements: 11.3 x 4.5 x 5.7 cm = volume: 151 mL. Echogenicity appears mildly increased. No mass or hydronephrosis visualized. Left Kidney: Renal measurements: 12.0 x 5.4 x 4.8 cm = volume: 164 mL. Echogenicity appears mildly increased. No mass or hydronephrosis visualized. Bladder: Appears normal for degree of bladder distention. Other: Incidentally noted moderate amount of sludge in the gallbladder. No gallbladder wall thickening. IMPRESSION: 1. Mild increase in echogenicity of the bilateral kidneys as can be seen in medical renal disease. No other acute finding. 2. Incidentally noted moderate amount of sludge in the gallbladder. No gallbladder wall thickening. Electronically Signed   By: 14/09/2020 M.D.   On: 10/15/2020 14:46    EKG: Independently reviewed.  Assessment/Plan Active Problems:   AKI (acute kidney injury) (HCC)   Lower extremity weakness   Hyponatremia   Narcolepsy   Avascular necrosis (HCC)   Type 2 diabetes mellitus without complication (HCC)   Rhabdomyolysis  Acute RIGHT lower extremity weakness/numbness -Patient with pain to palpation over L4-S1 RIGHT spinal process, appears to be hematoma present. -Patient appears to have true weakness/numbness on neuro exam, not from pain from hematoma. -CT L-spine pending.  Patient informed may require MRI -Empirically start Solu-Medrol 60 mg BID   Hx of avascular necrosis -Unsure of cause will interview patient in a.m. concerning cause. -Inpatients medical records also mention of bilateral THA.  DM type II without  complication -Patient on Metformin at home.  (Held) -Hemoglobin A1c/lipid panel pending -Moderate SSI  Hyponatremia -Most likely secondary to rhabdomyolysis/dehydration.  Should correct with aggressive fluid hydration. -See rhabdomyolysis  Rhabdomyolysis -CK from [facility] P just reads> 50,000 -CK pending we will trend -Bolus 1 L normal saline----> normal saline 147ml/hr  AKI -Most likely secondary to rhabdomyolysis -Hold all nephrotoxic medication -Strict in and out -Daily weight Lab Results  Component Value Date   CREATININE 3.86 (H) 10/15/2020   CREATININE 1.20 08/15/2020   CREATININE 1.11 02/07/2020  ]   Code Status: Full (DVT Prophylaxis: Subcu heparin Family Communication:   Status is: Inpatient    Dispo: The patient is from: Home              Anticipated d/c is to: Home              Anticipated d/c date is: 12/15              Patient currently unstable     Data Reviewed: Care during the described time interval was provided by me .  I have reviewed this patient's available data, including medical history, events of note, physical examination, and all test results as part of my evaluation.   The patient is critically ill with multiple organ systems failure and requires high complexity decision making for assessment and support, frequent evaluation and titration of therapies, application of advanced monitoring technologies and extensive interpretation of multiple databases. Critical Care Time devoted to patient care services described in this note  Time spent: 70 minutes   Dorothey Oetken, Roselind Messier [facility] Pager [phone]

## 2020-10-15 NOTE — ED Notes (Signed)
Critical lab value CK > 50,000 reported to [employer].

## 2020-10-15 NOTE — Progress Notes (Signed)
RN admit note: Received pt via stretcher via carelink. Oriented to room, verbalized understanding. See flowsheet for assessment. MD notified of admission. Placed on highfalls risk and advised not to get out of bed. Reports pain, MD notified. No distress noted, will continue to monitor pt. Closely. Rudene Re

## 2020-10-15 NOTE — ED Triage Notes (Addendum)
Pt arrived via EMS c/o R leg numbness and pain since yesterday at 9am. EMS reports +pulse, motor, sensation. He is walking with a limp. States it feels like "pins and needles"

## 2020-10-15 NOTE — ED Provider Notes (Signed)
[facility] EMERGENCY DEPARTMENT Provider Note   CSN: [id number] Arrival date & time: 10/15/20  1137     History Chief Complaint  Patient presents with  . Leg Pain  . Numbness    William Quinn is a 34 y.o. male.  HPI   Patient presents to the ED for evaluation of numbness of his right leg.  Patient states the symptoms started yesterday.  He feels like his whole right leg is having pins-and-needles.  Its not on one side versus the other.  Patient also has some mild discomfort with it but is not really having any significant pain.  He denies any back pain.  Patient states his foot also feels cold.  Past Medical History:  Diagnosis Date  . Avascular necrosis (HCC)   . Narcolepsy     There are no problems to display for this patient.   Past Surgical History:  Procedure Laterality Date  . JOINT REPLACEMENT    . TOTAL HIP ARTHROPLASTY Bilateral        No family history on file.  Social History   Tobacco Use  . Smoking status: Current Every Day Smoker    Types: Cigarettes  . Smokeless tobacco: Never Used  Vaping Use  . Vaping Use: Never used  Substance Use Topics  . Alcohol use: Not Currently  . Drug use: Not Currently    Home Medications Prior to Admission medications   Medication Sig Start Date End Date Taking? Authorizing Provider  hydrOXYzine (ATARAX/VISTARIL) 25 MG tablet Take 1 tablet (25 mg total) by mouth every 6 (six) hours as needed. 02/07/20   Tilden Fossa, MD  metFORMIN (GLUCOPHAGE) 500 MG tablet Take 1 tablet (500 mg total) by mouth 2 (two) times daily with a meal. 08/16/20   Palumbo, April, MD    Allergies    Patient has no known allergies.  Review of Systems   Review of Systems  All other systems reviewed and are negative.   Physical Exam Updated Vital Signs BP 122/86   Pulse 72   Temp 98.6 F (37 C) (Oral)   Resp 15   Ht 1.727 m (5\' 8" )   Wt 65.8 kg   SpO2 100%   BMI 22.05 kg/m   Physical Exam Vitals and nursing  note reviewed.  Constitutional:      General: He is not in acute distress.    Appearance: He is well-developed and well-nourished.  HENT:     Head: Normocephalic and atraumatic.     Right Ear: External ear normal.     Left Ear: External ear normal.  Eyes:     General: No scleral icterus.       Right eye: No discharge.        Left eye: No discharge.     Conjunctiva/sclera: Conjunctivae normal.  Neck:     Trachea: No tracheal deviation.  Cardiovascular:     Rate and Rhythm: Normal rate and regular rhythm.     Pulses: Intact distal pulses.     Comments: Strong dorsalis pedis pulse right foot, Pulmonary:     Effort: Pulmonary effort is normal. No respiratory distress.     Breath sounds: Normal breath sounds. No stridor. No wheezing or rales.  Abdominal:     General: Bowel sounds are normal. There is no distension.     Palpations: Abdomen is soft.     Tenderness: There is no abdominal tenderness. There is no guarding or rebound.  Musculoskeletal:  General: No tenderness or edema.     Cervical back: Neck supple.     Comments: Extremities warm and well perfused, no cyanosis, no rash  Skin:    General: Skin is warm and dry.     Findings: No rash.  Neurological:     Mental Status: He is alert.     Cranial Nerves: No cranial nerve deficit (no facial droop, extraocular movements intact, no slurred speech).     Sensory: No sensory deficit.     Motor: No abnormal muscle tone or seizure activity.     Coordination: Coordination normal.     Deep Tendon Reflexes: Strength normal.     Comments: Equal grip strength bilaterally, no facial droop, normal sensation bilateral upper extremities; decreased sensation to light touch of the entire leg in a stocking type pattern  Psychiatric:        Mood and Affect: Mood and affect normal.     ED Results / Procedures / Treatments   Labs (all labs ordered are listed, but only abnormal results are displayed) Labs Reviewed  CBC - Abnormal;  Notable for the following components:      Result Value   WBC 13.2 (*)    RBC 4.04 (*)    Hemoglobin 12.1 (*)    HCT 34.1 (*)    All other components within normal limits  BASIC METABOLIC PANEL - Abnormal; Notable for the following components:   Sodium 123 (*)    Chloride 91 (*)    BUN 50 (*)    Creatinine, Ser 3.86 (*)    Calcium 7.6 (*)    GFR, Estimated 20 (*)    All other components within normal limits  RESP PANEL BY RT-PCR (FLU A&B, COVID) ARPGX2  CK    EKG None  Radiology No results found.  Procedures Procedures (including critical care time)  Medications Ordered in ED Medications  sodium chloride 0.9 % bolus 500 mL (has no administration in time range)    Followed by  0.9 %  sodium chloride infusion (has no administration in time range)    ED Course  I have reviewed the triage vital signs and the nursing notes.  Pertinent labs & imaging results that were available during my care of the patient were reviewed by me and considered in my medical decision making (see chart for details).  Clinical Course as of 10/15/20 1410  Sat Oct 15, 2020  1355 Labs notable for hyponatremia and elevated BUN and creatinine. [JK]  1355 Leukocytosis also noted but not significantly different from previous values [JK]    Clinical Course User Index [JK] Linwood Dibbles, MD   MDM Rules/Calculators/A&P                         Patient presented to the ED for complaints of right leg numbness.  Patient also felt that his leg was cold.  On exam he has strong pulses.  No signs to suggest acute arterial occlusion.  Patient's numbness is in a stocking type pattern.  Argues against lumbar radiculopathy however the unilateral nature is atypical for some type of metabolic cause for his numbness.  Did check laboratory test to assess for any electrolyte abnormalities and the patient does have evidence of acute kidney injury with elevated BUN and creatinine.  Patient is also hyponatremic and this is all  new from 2 months ago.  I have ordered a renal ultrasound.  I will also add on a CK.  I will consult the medical service for admission and further evaluation. Final Clinical Impression(s) / ED Diagnoses Final diagnoses:  AKI (acute kidney injury) (HCC)  Paresthesia      Linwood Dibbles, MD 10/16/20 [phone]

## 2020-10-15 NOTE — ED Notes (Signed)
ED Provider at bedside. 

## 2020-10-16 ENCOUNTER — Inpatient Hospital Stay (HOSPITAL_COMMUNITY): Payer: Self-pay

## 2020-10-16 DIAGNOSIS — R29898 Other symptoms and signs involving the musculoskeletal system: Secondary | ICD-10-CM | POA: Diagnosis present

## 2020-10-16 DIAGNOSIS — T796XXS Traumatic ischemia of muscle, sequela: Secondary | ICD-10-CM

## 2020-10-16 LAB — CBC WITH DIFFERENTIAL/PLATELET
Abs Immature Granulocytes: 0.03 10*3/uL (ref 0.00–0.07)
Basophils Absolute: 0 10*3/uL (ref 0.0–0.1)
Basophils Relative: 0 %
Eosinophils Absolute: 0 10*3/uL (ref 0.0–0.5)
Eosinophils Relative: 0 %
HCT: 33.8 % — ABNORMAL LOW (ref 39.0–52.0)
Hemoglobin: 11.5 g/dL — ABNORMAL LOW (ref 13.0–17.0)
Immature Granulocytes: 0 %
Lymphocytes Relative: 7 %
Lymphs Abs: 0.4 10*3/uL — ABNORMAL LOW (ref 0.7–4.0)
MCH: 29.1 pg (ref 26.0–34.0)
MCHC: 34 g/dL (ref 30.0–36.0)
MCV: 85.6 fL (ref 80.0–100.0)
Monocytes Absolute: 0.3 10*3/uL (ref 0.1–1.0)
Monocytes Relative: 5 %
Neutro Abs: 5.9 10*3/uL (ref 1.7–7.7)
Neutrophils Relative %: 88 %
Platelets: 166 10*3/uL (ref 150–400)
RBC: 3.95 MIL/uL — ABNORMAL LOW (ref 4.22–5.81)
RDW: 13.1 % (ref 11.5–15.5)
WBC: 6.7 10*3/uL (ref 4.0–10.5)
nRBC: 0 % (ref 0.0–0.2)

## 2020-10-16 LAB — URINALYSIS, ROUTINE W REFLEX MICROSCOPIC
Bilirubin Urine: NEGATIVE
Glucose, UA: 50 mg/dL — AB
Ketones, ur: NEGATIVE mg/dL
Leukocytes,Ua: NEGATIVE
Nitrite: NEGATIVE
Protein, ur: 30 mg/dL — AB
Specific Gravity, Urine: 1.011 (ref 1.005–1.030)
pH: 5 (ref 5.0–8.0)

## 2020-10-16 LAB — TSH: TSH: 0.199 u[IU]/mL — ABNORMAL LOW (ref 0.350–4.500)

## 2020-10-16 LAB — COMPREHENSIVE METABOLIC PANEL
ALT: 209 U/L — ABNORMAL HIGH (ref 0–44)
AST: 505 U/L — ABNORMAL HIGH (ref 15–41)
Albumin: 3 g/dL — ABNORMAL LOW (ref 3.5–5.0)
Alkaline Phosphatase: 60 U/L (ref 38–126)
Anion gap: 13 (ref 5–15)
BUN: 63 mg/dL — ABNORMAL HIGH (ref 6–20)
CO2: 19 mmol/L — ABNORMAL LOW (ref 22–32)
Calcium: 7.8 mg/dL — ABNORMAL LOW (ref 8.9–10.3)
Chloride: 93 mmol/L — ABNORMAL LOW (ref 98–111)
Creatinine, Ser: 5.61 mg/dL — ABNORMAL HIGH (ref 0.61–1.24)
GFR, Estimated: 13 mL/min — ABNORMAL LOW (ref >60)
Glucose, Bld: 129 mg/dL — ABNORMAL HIGH (ref 70–99)
Potassium: 5.2 mmol/L — ABNORMAL HIGH (ref 3.5–5.1)
Sodium: 125 mmol/L — ABNORMAL LOW (ref 135–145)
Total Bilirubin: 0.8 mg/dL (ref 0.3–1.2)
Total Protein: 5.6 g/dL — ABNORMAL LOW (ref 6.5–8.1)

## 2020-10-16 LAB — GLUCOSE, CAPILLARY
Glucose-Capillary: 116 mg/dL — ABNORMAL HIGH (ref 70–99)
Glucose-Capillary: 116 mg/dL — ABNORMAL HIGH (ref 70–99)
Glucose-Capillary: 125 mg/dL — ABNORMAL HIGH (ref 70–99)
Glucose-Capillary: 126 mg/dL — ABNORMAL HIGH (ref 70–99)
Glucose-Capillary: 134 mg/dL — ABNORMAL HIGH (ref 70–99)
Glucose-Capillary: 134 mg/dL — ABNORMAL HIGH (ref 70–99)
Glucose-Capillary: 162 mg/dL — ABNORMAL HIGH (ref 70–99)

## 2020-10-16 LAB — OSMOLALITY: Osmolality: 294 mOsm/kg (ref 275–295)

## 2020-10-16 LAB — NA AND K (SODIUM & POTASSIUM), RAND UR
Potassium Urine: 34 mmol/L
Sodium, Ur: 10 mmol/L

## 2020-10-16 LAB — RAPID URINE DRUG SCREEN, HOSP PERFORMED
Amphetamines: NOT DETECTED
Barbiturates: NOT DETECTED
Benzodiazepines: NOT DETECTED
Cocaine: NOT DETECTED
Opiates: NOT DETECTED
Tetrahydrocannabinol: NOT DETECTED

## 2020-10-16 LAB — CK: Total CK: 66457 U/L — ABNORMAL HIGH (ref 49–397)

## 2020-10-16 LAB — HEMOGLOBIN A1C
Hgb A1c MFr Bld: 5.4 % (ref 4.8–5.6)
Mean Plasma Glucose: 108.28 mg/dL

## 2020-10-16 LAB — HIV ANTIBODY (ROUTINE TESTING W REFLEX): HIV Screen 4th Generation wRfx: NONREACTIVE

## 2020-10-16 LAB — MAGNESIUM: Magnesium: 2.5 mg/dL — ABNORMAL HIGH (ref 1.7–2.4)

## 2020-10-16 LAB — PHOSPHORUS: Phosphorus: 6.9 mg/dL — ABNORMAL HIGH (ref 2.5–4.6)

## 2020-10-16 LAB — URIC ACID: Uric Acid, Serum: 12 mg/dL — ABNORMAL HIGH (ref 3.7–8.6)

## 2020-10-16 MED ORDER — SODIUM ZIRCONIUM CYCLOSILICATE 10 G PO PACK
10.0000 g | PACK | Freq: Two times a day (BID) | ORAL | Status: AC
  Administered 2020-10-16 (×2): 10 g via ORAL
  Filled 2020-10-16 (×2): qty 1

## 2020-10-16 MED ORDER — INSULIN ASPART 100 UNIT/ML ~~LOC~~ SOLN
0.0000 [IU] | SUBCUTANEOUS | Status: DC
  Administered 2020-10-16 – 2020-10-17 (×4): 1 [IU] via SUBCUTANEOUS
  Administered 2020-10-19: 2 [IU] via SUBCUTANEOUS
  Administered 2020-10-21 – 2020-10-22 (×5): 1 [IU] via SUBCUTANEOUS

## 2020-10-16 MED ORDER — CHLORHEXIDINE GLUCONATE CLOTH 2 % EX PADS
6.0000 | MEDICATED_PAD | Freq: Every day | CUTANEOUS | Status: DC
  Administered 2020-10-17 – 2020-10-27 (×11): 6 via TOPICAL

## 2020-10-16 NOTE — Progress Notes (Signed)
PROGRESS NOTE    William Quinn  NLG:[id number] DOB: 06-30-1986 DOA: 10/15/2020 PCP: Patient, No Pcp Per     Brief Narrative:  William Quinn is a 34 yo WM  PMHx Avascular necrosis, Narcolepsy, DM type II  presents to the ED for evaluation of numbness of his right leg. Patient states the symptoms started yesterday. He feels like his whole right leg is having pins-and-needles. Its not on one side versus the other. Patient also has some mild discomfort with it but is not really having any significant pain. He denies any back pain. Patient states his foot also feels cold  Patient's numbness is in a stocking type pattern. Argues against lumbar radiculopathy however the unilateral nature is atypical for some type of metabolic cause for his numbness. Did check laboratory test to assess for any electrolyte abnormalities and the patient does have evidence of acute kidney injury with elevated BUN and creatinine. Patient is also hyponatremic and this is all new from 2 months ago. I have ordered a renal ultrasound. I will also add on a CK.  Patient states that yesterday he fell on a tile floor fairly hard.  Then began to experience weakness and numbness in his RIGHT lower extremity.    Subjective: 12/12 patient currently states that he has been unable to urinate but has the urge.  States that in the past he was initially diagnosed with multiple sclerosis started on long-term   Assessment & Plan: Covid vaccination; vaccinated   Active Problems:   AKI (acute kidney injury) (HCC)   Lower extremity weakness   Hyponatremia   Narcolepsy   Avascular necrosis (HCC)   Type 2 diabetes mellitus without complication (HCC)   Rhabdomyolysis   Acute RIGHT lower extremity weakness/numbness -Patient with pain to palpation over L4-S1 RIGHT spinal process, appears to be hematoma present. -Patient appears to have true weakness/numbness on neuro exam, not from pain from hematoma. -CT L-spine no  evidence of disc herniation.  See results below  -12/12 DC Solu-Medrol 60 mg BID  Hx of avascular necrosis -12/12 per patient earlier in his life he was treated for presumed multiple sclerosis with long-term steroids.  Developed bilateral avascular necrosis.  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  DM type II without complication -Patient on Metformin at home.  (Held) -12/11 Hemoglobin A1c = 5.4  -12/11 LDL= 66 -12/12 decrease Sensitive SSI  Hyponatremia -Most likely secondary to rhabdomyolysis/dehydration.  Should correct with aggressive fluid hydration. -See rhabdomyolysis Lab Results  Component Value Date   NA 125 (L) 10/16/2020   NA 123 (L) 10/15/2020   NA 133 (L) 08/15/2020   NA 137 02/07/2020    Rhabdomyolysis -Rapid urine tox screen pending CK from [facility] P just reads> 50,000 -CK pending we will trend -Bolus 1 L normal saline----> normal saline 17ml/hr -12/12 increase normal saline 242ml/hr -12/12 urine tox screen negative Lab Results  Component Value Date   CKTOTAL 66,457 (H) 10/15/2020   CKTOTAL >50,000 (H) 10/15/2020   AKI -Most likely secondary to rhabdomyolysis -Hold all nephrotoxic medication -Strict in and out -Daily weight Lab Results  Component Value Date   CREATININE 5.61 (H) 10/16/2020   CREATININE 4.95 (H) 10/15/2020   CREATININE 3.86 (H) 10/15/2020   CREATININE 1.20 08/15/2020   CREATININE 1.11 02/07/2020    Hyperkalemia -Lokelma 10 g x2 dose    DVT prophylaxis: Subcu heparin Code  Status: Full Family Communication:  Status is: Inpatient    Dispo: The patient is from: Home              Anticipated d/c is to: Home              Anticipated d/c date is: 12/19              Patient currently unstable      Consultants:  Nephrology   Procedures/Significant Events:  12/11 CT L-spine W0 contrast;Mild left  convex lumbar scoliosis. -No acute bony findings or lumbar disc protrusions, spinal or foraminal stenosis. 12/11 US renal;Mild increase in echogenicity of the bilateral kidneys as can be seen in medical renal disease. No other acute finding. -Incidentally noted moderate amount of sludge in the gallbladder.No gallbladder wall thickening   I have personally reviewed and interpreted all radiology studies and my findings are as above.  VENTILATOR SETTINGS:    Cultures   Antimicrobials:    Devices    LINES / TUBES:      Continuous Infusions: . sodium chloride 125 mL/hr at 10/16/20 0400  . sodium chloride 150 mL/hr at 10/16/20 0604     Objective: Vitals:   10/15/20 1716 10/15/20 2259 10/16/20 0103 10/16/20 0520  BP: 130/70 124/74 122/73 121/70  Pulse: 70 70 60 (!) 58  Resp: 18 16 16 16   Temp: 99.1 F (37.3 C) 99 F (37.2 C) 99 F (37.2 C) 98.3 F (36.8 C)  TempSrc: Oral Oral Oral Oral  SpO2: 99% 96% 97% 97%  Weight:      Height:        Intake/Output Summary (Last 24 hours) at 10/16/2020 14/10/2020 Last data filed at 10/16/2020 0631 Gross per 24 hour  Intake 1850 ml  Output --  Net 1850 ml   Filed Weights   10/15/20 1143  Weight: 65.8 kg    Examination:  General: A/O x4 No acute respiratory distress Eyes: negative scleral hemorrhage, negative anisocoria, negative icterus ENT: Negative Runny nose, negative gingival bleeding, Neck:  Negative scars, masses, torticollis, lymphadenopathy, JVD Lungs: Clear to auscultation bilaterally without wheezes or crackles Cardiovascular: Regular rate and rhythm without murmur gallop or rub normal S1 and S2 Abdomen: Positive abdominal pain over his bladder, nondistended, positive soft, bowel sounds, no rebound, no ascites, no appreciable mass Extremities: No significant cyanosis, clubbing, or edema bilateral lower extremities Skin: Negative rashes, lesions, ulcers Psychiatric:  Negative depression, negative anxiety,  negative fatigue, negative mania  Central nervous system:  Cranial nerves II through XII intact, tongue/uvula midline, all extremities muscle strength 5/5 except RIGHT lower extremity.  RIGHT lower extremity strength 4/5, sensation intact throughout, except RIGHT lower extremity.  RIGHT lower extremity decreased sensation to pinprick and soft touch from hip to toes.  negative dysarthria, negative expressive aphasia, negative receptive aphasia.  NOTE weakness seems mildly improved .     Data Reviewed: Care during the described time interval was provided by me .  I have reviewed this patient's available data, including medical history, events of note, physical examination, and all test results as part of my evaluation.  CBC: Recent Labs  Lab 10/15/20 1325 10/15/20 2155 10/16/20 0544  WBC 13.2* 9.4 6.7  NEUTROABS  --   --  5.9  HGB 12.1* 10.9* 11.5*  HCT 34.1* 31.7* 33.8*  MCV 84.4 84.3 85.6  PLT 171 159 166   Basic Metabolic Panel: Recent Labs  Lab 10/15/20 1325 10/15/20 2155  NA 123*  --   K 4.1  --  CL 91*  --   CO2 23  --   GLUCOSE 99  --   BUN 50*  --   CREATININE 3.86* 4.95*  CALCIUM 7.6*  --    GFR: Estimated Creatinine Clearance: 19.2 mL/min (A) (by C-G formula based on SCr of 4.95 mg/dL (H)). Liver Function Tests: No results for input(s): AST, ALT, ALKPHOS, BILITOT, PROT, ALBUMIN in the last 168 hours. No results for input(s): LIPASE, AMYLASE in the last 168 hours. No results for input(s): AMMONIA in the last 168 hours. Coagulation Profile: No results for input(s): INR, PROTIME in the last 168 hours. Cardiac Enzymes: Recent Labs  Lab 10/15/20 1325 10/15/20 2155  CKTOTAL >50,000* 66,457*   BNP (last 3 results) No results for input(s): PROBNP in the last 8760 hours. HbA1C: Recent Labs    10/15/20 2155  HGBA1C 5.4   CBG: Recent Labs  Lab 10/15/20 1845 10/15/20 2054 10/16/20 0101 10/16/20 0522  GLUCAP 78 103* 116* 126*   Lipid Profile: Recent  Labs    10/15/20 2155  CHOL 127  HDL 29*  LDLCALC 66  TRIG 161161*  CHOLHDL 4.4   Thyroid Function Tests: No results for input(s): TSH, T4TOTAL, FREET4, T3FREE, THYROIDAB in the last 72 hours. Anemia Panel: No results for input(s): VITAMINB12, FOLATE, FERRITIN, TIBC, IRON, RETICCTPCT in the last 72 hours. Sepsis Labs: No results for input(s): PROCALCITON, LATICACIDVEN in the last 168 hours.  Recent Results (from the past 240 hour(s))  Resp Panel by RT-PCR (Flu A&B, Covid) Nasopharyngeal Swab     Status: None   Collection Time: 10/15/20  1:57 PM   Specimen: Nasopharyngeal Swab; Nasopharyngeal(NP) swabs in vial transport medium  Result Value Ref Range Status   SARS Coronavirus 2 by RT PCR NEGATIVE NEGATIVE Final    Comment: (NOTE) SARS-CoV-2 target nucleic acids are NOT DETECTED.  The SARS-CoV-2 RNA is generally detectable in upper respiratory specimens during the acute phase of infection. The lowest concentration of SARS-CoV-2 viral copies this assay can detect is 138 copies/mL. A negative result does not preclude SARS-Cov-2 infection and should not be used as the sole basis for treatment or other patient management decisions. A negative result may occur with  improper specimen collection/handling, submission of specimen other than nasopharyngeal swab, presence of viral mutation(s) within the areas targeted by this assay, and inadequate number of viral copies(<138 copies/mL). A negative result must be combined with clinical observations, patient history, and epidemiological information. The expected result is Negative.  Fact Sheet for Patients:  BloggerCourse.comhttps://www.fda.gov/media/152166/download  Fact Sheet for Healthcare Providers:  SeriousBroker.ithttps://www.fda.gov/media/152162/download  This test is no t yet approved or cleared by the [place] FDA and  has been authorized for detection and/or diagnosis of SARS-CoV-2 by FDA under an Emergency Use Authorization (EUA). This EUA will remain   in effect (meaning this test can be used) for the duration of the COVID-19 declaration under Section 564(b)(1) of the Act, 21 U.S.C.section 360bbb-3(b)(1), unless the authorization is terminated  or revoked sooner.       Influenza A by PCR NEGATIVE NEGATIVE Final   Influenza B by PCR NEGATIVE NEGATIVE Final    Comment: (NOTE) The Xpert Xpress SARS-CoV-2/FLU/RSV plus assay is intended as an aid in the diagnosis of influenza from Nasopharyngeal swab specimens and should not be used as a sole basis for treatment. Nasal washings and aspirates are unacceptable for Xpert Xpress SARS-CoV-2/FLU/RSV testing.  Fact Sheet for Patients: BloggerCourse.comhttps://www.fda.gov/media/152166/download  Fact Sheet for Healthcare Providers: SeriousBroker.ithttps://www.fda.gov/media/152162/download  This test is not yet approved  or cleared by the [place] and has been authorized for detection and/or diagnosis of SARS-CoV-2 by FDA under an Emergency Use Authorization (EUA). This EUA will remain in effect (meaning this test can be used) for the duration of the COVID-19 declaration under Section 564(b)(1) of the Act, 21 U.S.C. section 360bbb-3(b)(1), unless the authorization is terminated or revoked.  Performed at [facility], [address], [place], [place] [address]          Radiology Studies: CT LUMBAR SPINE WO CONTRAST  Result Date: 10/15/2020 CLINICAL DATA:  Larey Seat yesterday.  Back and right leg pain. EXAM: CT LUMBAR SPINE WITHOUT CONTRAST TECHNIQUE: Multidetector CT imaging of the lumbar spine was performed without intravenous contrast administration. Multiplanar CT image reconstructions were also generated. COMPARISON:  None available. FINDINGS: Segmentation: There are five lumbar type vertebral bodies. The last full intervertebral disc space is labeled L5-S1. Alignment: Mild left convex lumbar scoliosis. Normal alignment in the sagittal plane. Vertebrae: No fractures or bone lesions. Paraspinal and  other soft tissues: No significant findings. Disc levels: No lumbar disc protrusions, spinal or foraminal stenosis. IMPRESSION: 1. Mild left convex lumbar scoliosis. 2. No acute bony findings or lumbar disc protrusions, spinal or foraminal stenosis. Electronically Signed   By: Rudie Meyer M.D.   On: 10/15/2020 18:51   US Renal  Result Date: 10/15/2020 CLINICAL DATA:  Acute renal failure. EXAM: RENAL / URINARY TRACT ULTRASOUND COMPLETE COMPARISON:  None. FINDINGS: Right Kidney: Renal measurements: 11.3 x 4.5 x 5.7 cm = volume: 151 mL. Echogenicity appears mildly increased. No mass or hydronephrosis visualized. Left Kidney: Renal measurements: 12.0 x 5.4 x 4.8 cm = volume: 164 mL. Echogenicity appears mildly increased. No mass or hydronephrosis visualized. Bladder: Appears normal for degree of bladder distention. Other: Incidentally noted moderate amount of sludge in the gallbladder. No gallbladder wall thickening. IMPRESSION: 1. Mild increase in echogenicity of the bilateral kidneys as can be seen in medical renal disease. No other acute finding. 2. Incidentally noted moderate amount of sludge in the gallbladder. No gallbladder wall thickening. Electronically Signed   By: Emmaline Kluver M.D.   On: 10/15/2020 14:46        Scheduled Meds: . heparin  5,000 Units Subcutaneous Q8H  . influenza vac split quadrivalent PF  0.5 mL Intramuscular Tomorrow-1000  . insulin aspart  0-15 Units Subcutaneous Q4H  . methylPREDNISolone (SOLU-MEDROL) injection  60 mg Intravenous Q12H  . pneumococcal 23 valent vaccine  0.5 mL Intramuscular Tomorrow-1000   Continuous Infusions: . sodium chloride 125 mL/hr at 10/16/20 0400  . sodium chloride 150 mL/hr at 10/16/20 0604     LOS: 1 day    Time spent:40 min    Chevella Pearce, Roselind Messier, MD [facility] Pager [phone]  If 7PM-7AM, please contact night-coverage [email or web address] Password [facility] 10/16/2020, 7:42 AM

## 2020-10-16 NOTE — Consult Note (Signed)
Nephrology Consult  [facility]  Requesting provider: Allie Bossier, MD   Assessment/Recommendations:   AKI, likely related to prolonged rhabdo possibly -h/o negative ANA back in 2016 -continue with fluids, volume status acceptable at the moment -no indiciation for renal replacement therapy as of yet but would recommend having a low threshold to transfer patient to [facility] -his clinical course and labs will dictate if he will need a biopsy but at this junction there would a low yield if this were to happen -ultrasound without obstruction -UA w/ sediment exam pending -Continue to monitor daily Cr, Dose meds for GFR<15 -Monitor Daily I/Os, Daily weight  -Maintain MAP>65 for optimal renal perfusion.  -avoid further nephrotoxins including NSAIDS, Morphine.  Unless absolutely necessary, avoid CT with contrast and/or MRI with gadolinium.     Right LE ext weakness/numbness -does have a hematoma present on imaging -on steroids per primary  Rhabdomyolysis -would recommend checking ESR and CRP, may need to consider a punch biopsy? -u tox pending -fluids  Hyponatremia -encouraging sign that his Na improved with isotonic fluids -encouraged solute intake -checking serum osm, urine osm, urine lytes, tsh, uric acid  Hyperkalemia -secondary to AKI -lokelma, fluids, restrict K in diet  Metabolic acidosis -likely related to AKI and rhabdo -bicarb ok for now  Hypoalbuminemia -push protein  Transaminitis -likely related to rhabdo, would check an abd u/s, mgmt per primary -trend, on fluids  Anemia -likely dilutional, hgb stable, monitor. Transfuse prn    Diabetes Mellitus Type 2 with Hyperglycemia -mgmt per primary service   Recommendations conveyed to primary service.    [facility] 10/16/2020 4:26 PM   _____________________________________________________________________________________   History of Present Illness: William Quinn is a/an 34 y.o. male with a past medical history of h/o total hip arthoplasty, DM2, narcolepsy, fibromyalgia, chronic pain syndrome who presents to [facility] with right leg numbness.  Had a fall on tile floor 2-3 days prior to feeling numbness. Since his fall he has been rather immobilized. Had taken one aspirin, some tylenol, and tramadol in the interim. Was found to be in rhabdomyolysis and AKI.  Baseline creatinine around 1.1-1.2.  Presented with a creatinine of 3.9 which is now up trended to 5.6 despite fluids.  His CPKs greater than 50,000 on admit.  Did receive Has a history of avascular necrosis from steroids in the past. It was thought that he had MS at some point but turns out that was not the case. He is not aware of any autoimmune work that he's had. Does not use NSAIDs. Does not have a family history of any AI or kidney diseases that he is aware of. He does report urinating less today. Denies fevers, chest pain, SOB, dizziness, abdominal pain, dysuria, hematuria, rash, changes in vision.   Medications:  Current Facility-Administered Medications  Medication Dose Route Frequency Provider Last Rate Last Admin   0.9 %  sodium chloride infusion  1,000 mL Intravenous Continuous Dorie Rank, MD 125 mL/hr at 10/16/20 0400 Infusion Verify at 10/16/20 0400   0.9 %  sodium chloride infusion   Intravenous Continuous Allie Bossier, MD 150 mL/hr at 10/16/20 1301 New Bag at 10/16/20 1301   acetaminophen (TYLENOL) tablet 650 mg  650 mg Oral Q6H PRN Allie Bossier, MD   650 mg at 10/15/20 1907   heparin injection 5,000 Units  5,000 Units Subcutaneous Q8H Allie Bossier, MD   5,000 Units at 10/16/20 1403   influenza vac split quadrivalent PF (FLUARIX)  injection 0.5 mL  0.5 mL Intramuscular Tomorrow-1000 Jegede, Olugbemiga E, MD       insulin aspart (novoLOG) injection 0-9 Units  0-9 Units Subcutaneous Q4H Allie Bossier, MD       pneumococcal 23 valent vaccine (PNEUMOVAX-23) injection 0.5 mL  0.5  mL Intramuscular Tomorrow-1000 Jegede, Olugbemiga E, MD       sodium zirconium cyclosilicate (LOKELMA) packet 10 g  10 g Oral BID Allie Bossier, MD   10 g at 10/16/20 1258   traMADol (ULTRAM) tablet 50 mg  50 mg Oral Q6H PRN Allie Bossier, MD   50 mg at 10/16/20 1556     ALLERGIES Patient has no known allergies.  MEDICAL HISTORY Past Medical History:  Diagnosis Date   Avascular necrosis ([place])    Narcolepsy      SOCIAL HISTORY Social History   Socioeconomic History   Marital status: Single    Spouse name: Not on file   Number of children: Not on file   Years of education: Not on file   Highest education level: Not on file  Occupational History   Not on file  Tobacco Use   Smoking status: Current Every Day Smoker    Types: Cigarettes   Smokeless tobacco: Never Used  Vaping Use   Vaping Use: Never used  Substance and Sexual Activity   Alcohol use: Not Currently   Drug use: Not Currently   Sexual activity: Not on file  Other Topics Concern   Not on file  Social History Narrative   Not on file   Social Determinants of Health   Financial Resource Strain: Not on file  Food Insecurity: Not on file  Transportation Needs: Not on file  Physical Activity: Not on file  Stress: Not on file  Social Connections: Not on file  Intimate Partner Violence: Not on file     FAMILY HISTORY No family history of kidney disease  Review of Systems: 12 systems reviewed Otherwise as per HPI, all other systems reviewed and negative  Physical Exam: Vitals:   10/16/20 0520 10/16/20 1439  BP: 121/70 118/70  Pulse: (!) 58 (!) 59  Resp: 16 16  Temp: 98.3 F (36.8 C) 98.3 F (36.8 C)  SpO2: 97% 99%   Total I/O In: 480 [P.O.:480] Out: -   Intake/Output Summary (Last 24 hours) at 10/16/2020 1626 Last data filed at 10/16/2020 1439 Gross per 24 hour  Intake 1830 ml  Output --  Net 1830 ml   General: well-appearing, no acute distress HEENT: anicteric  sclera, oropharynx clear without lesions CV: regular rate, normal rhythm, no murmurs, no gallops, no rubs, no peripheral edema Lungs: clear to auscultation bilaterally, normal work of breathing Abd: soft, non-tender, non-distended Skin: no visible lesions or rashes Psych: alert, engaged, appropriate mood and affect Neuro: normal speech, no gross focal deficits   Test Results Reviewed Lab Results  Component Value Date   NA 125 (L) 10/16/2020   K 5.2 (H) 10/16/2020   CL 93 (L) 10/16/2020   CO2 19 (L) 10/16/2020   BUN 63 (H) 10/16/2020   CREATININE 5.61 (H) 10/16/2020   CALCIUM 7.8 (L) 10/16/2020   ALBUMIN 3.0 (L) 10/16/2020   PHOS 6.9 (H) 10/16/2020     I have reviewed all relevant outside healthcare records related to the patient's kidney injury.

## 2020-10-16 NOTE — Progress Notes (Signed)
NP on call notified that patient has only had 80 cc of urine in past 12 hours (dayshift). Patient's IVF were set at 200. Patient at shift change c/o SOB. MD notified. IVF at 200 stopped and 125cc/hr resumed. Will follow patient output overnight. Per previous RN, this low output has been a recurrent issue. Patient has been bladder scanned multiple times over the last 2 days showing 0-57cc.  Urine is clear, yellow. William Quinn

## 2020-10-17 ENCOUNTER — Other Ambulatory Visit: Payer: Self-pay

## 2020-10-17 DIAGNOSIS — R202 Paresthesia of skin: Secondary | ICD-10-CM

## 2020-10-17 DIAGNOSIS — G47419 Narcolepsy without cataplexy: Secondary | ICD-10-CM

## 2020-10-17 LAB — COMPREHENSIVE METABOLIC PANEL
ALT: 156 U/L — ABNORMAL HIGH (ref 0–44)
AST: 248 U/L — ABNORMAL HIGH (ref 15–41)
Albumin: 2.9 g/dL — ABNORMAL LOW (ref 3.5–5.0)
Alkaline Phosphatase: 50 U/L (ref 38–126)
Anion gap: 12 (ref 5–15)
BUN: 87 mg/dL — ABNORMAL HIGH (ref 6–20)
CO2: 20 mmol/L — ABNORMAL LOW (ref 22–32)
Calcium: 7.7 mg/dL — ABNORMAL LOW (ref 8.9–10.3)
Chloride: 96 mmol/L — ABNORMAL LOW (ref 98–111)
Creatinine, Ser: 7.82 mg/dL — ABNORMAL HIGH (ref 0.61–1.24)
GFR, Estimated: 8 mL/min — ABNORMAL LOW (ref >60)
Glucose, Bld: 112 mg/dL — ABNORMAL HIGH (ref 70–99)
Potassium: 5.2 mmol/L — ABNORMAL HIGH (ref 3.5–5.1)
Sodium: 128 mmol/L — ABNORMAL LOW (ref 135–145)
Total Bilirubin: 0.7 mg/dL (ref 0.3–1.2)
Total Protein: 5.4 g/dL — ABNORMAL LOW (ref 6.5–8.1)

## 2020-10-17 LAB — CBC WITH DIFFERENTIAL/PLATELET
Abs Immature Granulocytes: 0.07 10*3/uL (ref 0.00–0.07)
Basophils Absolute: 0 10*3/uL (ref 0.0–0.1)
Basophils Relative: 0 %
Eosinophils Absolute: 0 10*3/uL (ref 0.0–0.5)
Eosinophils Relative: 0 %
HCT: 29.1 % — ABNORMAL LOW (ref 39.0–52.0)
Hemoglobin: 9.9 g/dL — ABNORMAL LOW (ref 13.0–17.0)
Immature Granulocytes: 1 %
Lymphocytes Relative: 9 %
Lymphs Abs: 1.1 10*3/uL (ref 0.7–4.0)
MCH: 29.1 pg (ref 26.0–34.0)
MCHC: 34 g/dL (ref 30.0–36.0)
MCV: 85.6 fL (ref 80.0–100.0)
Monocytes Absolute: 0.9 10*3/uL (ref 0.1–1.0)
Monocytes Relative: 8 %
Neutro Abs: 9.7 10*3/uL — ABNORMAL HIGH (ref 1.7–7.7)
Neutrophils Relative %: 82 %
Platelets: 147 10*3/uL — ABNORMAL LOW (ref 150–400)
RBC: 3.4 MIL/uL — ABNORMAL LOW (ref 4.22–5.81)
RDW: 13.3 % (ref 11.5–15.5)
WBC: 11.7 10*3/uL — ABNORMAL HIGH (ref 4.0–10.5)
nRBC: 0 % (ref 0.0–0.2)

## 2020-10-17 LAB — PHOSPHORUS: Phosphorus: 7.3 mg/dL — ABNORMAL HIGH (ref 2.5–4.6)

## 2020-10-17 LAB — GLUCOSE, CAPILLARY
Glucose-Capillary: 107 mg/dL — ABNORMAL HIGH (ref 70–99)
Glucose-Capillary: 103 mg/dL — ABNORMAL HIGH (ref 70–99)
Glucose-Capillary: 97 mg/dL (ref 70–99)
Glucose-Capillary: 128 mg/dL — ABNORMAL HIGH (ref 70–99)

## 2020-10-17 LAB — OSMOLALITY, URINE: Osmolality, Ur: 251 mOsm/kg — ABNORMAL LOW (ref 300–900)

## 2020-10-17 LAB — MAGNESIUM: Magnesium: 2.7 mg/dL — ABNORMAL HIGH (ref 1.7–2.4)

## 2020-10-17 LAB — CK: Total CK: 23778 U/L — ABNORMAL HIGH (ref 49–397)

## 2020-10-17 MED ORDER — SODIUM CHLORIDE 0.9 % IV SOLN
1000.0000 mL | INTRAVENOUS | Status: DC
  Administered 2020-10-17 – 2020-10-18 (×3): 1000 mL via INTRAVENOUS

## 2020-10-17 MED ORDER — PNEUMOCOCCAL VAC POLYVALENT 25 MCG/0.5ML IJ INJ
0.5000 mL | INJECTION | INTRAMUSCULAR | Status: DC | PRN
  Filled 2020-10-17: qty 0.5

## 2020-10-17 MED ORDER — TRAMADOL HCL 50 MG PO TABS: 50.0000 mg | ORAL_TABLET | Freq: Four times a day (QID) | ORAL | Status: DC

## 2020-10-17 MED ORDER — PREDNISONE 50 MG PO TABS
50.0000 mg | ORAL_TABLET | Freq: Every day | ORAL | Status: DC
  Administered 2020-10-17 – 2020-10-21 (×5): 50 mg via ORAL
  Filled 2020-10-17 (×5): qty 1

## 2020-10-17 MED ORDER — INFLUENZA VAC SPLIT QUAD 0.5 ML IM SUSY
0.5000 mL | PREFILLED_SYRINGE | INTRAMUSCULAR | Status: DC | PRN
  Filled 2020-10-17: qty 0.5

## 2020-10-17 MED ORDER — SODIUM ZIRCONIUM CYCLOSILICATE 10 G PO PACK
10.0000 g | PACK | Freq: Two times a day (BID) | ORAL | Status: DC
  Administered 2020-10-17 – 2020-10-19 (×6): 10 g via ORAL
  Filled 2020-10-17: qty 1
  Filled 2020-10-17 (×3): qty 2
  Filled 2020-10-17 (×3): qty 1
  Filled 2020-10-17 (×2): qty 2

## 2020-10-17 MED ORDER — FENTANYL CITRATE (PF) 100 MCG/2ML IJ SOLN
25.0000 ug | INTRAMUSCULAR | Status: DC | PRN
  Administered 2020-10-17 – 2020-10-31 (×49): 25 ug via INTRAVENOUS
  Filled 2020-10-17 (×2): qty 2
  Filled 2020-10-17: qty 0.5
  Filled 2020-10-17 (×9): qty 2
  Filled 2020-10-17: qty 0.5
  Filled 2020-10-17 (×37): qty 2

## 2020-10-17 MED ORDER — SODIUM CHLORIDE 0.9 % IV SOLN: 1000.0000 mL | INTRAVENOUS | Status: DC

## 2020-10-17 NOTE — Progress Notes (Signed)
Patient has voided only 75 cc this am. Only a total of 95 cc this shift so far. NP on call again notified.Priscille Kluver

## 2020-10-17 NOTE — Progress Notes (Signed)
 [facility] Progress Note  Subjective: seen in room, no N/V, no jerking or sleepiness or confusion. Creat up 7.8 today. UOP 125 cc today, 100 cc yest.  BP's sof to normal.   Vitals:   10/16/20 1439 10/16/20 2125 10/17/20 0439 10/17/20 1349  BP: 118/70 112/66 109/69 118/71  Pulse: (!) 59 (!) 59 (!) 51 (!) 49  Resp: 16 16 16 16   Temp: 98.3 F (36.8 C) 99 F (37.2 C) 98.5 F (36.9 C) 98.1 F (36.7 C)  TempSrc: Oral Oral Oral Oral  SpO2: 99% 97% 98% 98%  Weight:      Height:        Exam:   alert, nad   no jvd  Chest cta bilat  Cor reg no RG  Abd soft ntnd no ascites   Ext no LE edema   Alert, NF, ox3  UA - prot 30, 0-5 rbc, large Hb, 6-10 wbc   Renal - IMPRESSION: Mild increase in echogenicity of the bilateral kidneys as can be seen in medical renal disease. No other acute finding.  Assessment/ Plan: 1. AKI - due to ATN oliguric due to acute rhabdomyolysis w/ CPK > 50,000. Renal US unremarkable. UA large Hb w/ small RBC's and wbc's.  Volume status acceptable at the moment, will ^IVF's to 125 cc/hr. No need for RRT yet but would recommend transfer patient to [facility] because will likely need HD in 1-2 days. 2. Right LE ext weakness/numbness - due to acute rhabdo 3. Hyponatremia - better at 128 today.  4. Hyperkalemia - due to tissue damage, AKI. K 5.2 today.  5. Metabolic acidosis - due to tissue damage, AKI. CO2 20 today 6. Transaminitis - rhabdo will cause ^^ AST / ALT that looks like liver damage but is really muscle damage related    William Quinn 10/17/2020, 2:32 PM   Recent Labs  Lab 10/16/20 0544 10/17/20 0518  K 5.2* 5.2*  BUN 63* 87*  CREATININE 5.61* 7.82*  CALCIUM 7.8* 7.7*  PHOS 6.9* 7.3*  HGB 11.5* 9.9*   Inpatient medications: . Chlorhexidine Gluconate Cloth  6 each Topical Daily  . heparin  5,000 Units Subcutaneous Q8H  . insulin aspart  0-9 Units Subcutaneous Q4H  . predniSONE  50 mg Oral Q breakfast  . sodium zirconium  cyclosilicate  10 g Oral BID   . sodium chloride 100 mL/hr at 10/17/20 0414   acetaminophen, influenza vac split quadrivalent PF, pneumococcal 23 valent vaccine, traMADol

## 2020-10-17 NOTE — Progress Notes (Signed)
Foley was placed in patient for more accurate output monitoring. NP was again notified regarding little to no output (only 20 cc's in 4 hours) at around 1130 pm. Despite low urine output, NP still wanted to hold IVF b/c of patient's shortness of breath. IVF will resume at 100/hr at 4 am. Will continue to monitor.Priscille Kluver

## 2020-10-17 NOTE — Progress Notes (Signed)
PROGRESS NOTE    William Quinn  ZOX:[id number] DOB: 08-26-1986 DOA: 10/15/2020 PCP: Patient, No Pcp Per     Brief Narrative:  William Quinn is a 34 yo WM  PMHx Avascular necrosis, Narcolepsy, DM type II  presents to the ED for evaluation of numbness of his right leg. Patient states the symptoms started yesterday. He feels like his whole right leg is having pins-and-needles. Its not on one side versus the other. Patient also has some mild discomfort with it but is not really having any significant pain. He denies any back pain. Patient states his foot also feels cold  Patient's numbness is in a stocking type pattern. Argues against lumbar radiculopathy however the unilateral nature is atypical for some type of metabolic cause for his numbness. Did check laboratory test to assess for any electrolyte abnormalities and the patient does have evidence of acute kidney injury with elevated BUN and creatinine. Patient is also hyponatremic and this is all new from 2 months ago. I have ordered a renal ultrasound. I will also add on a CK.  Patient states that yesterday he fell on a tile floor fairly hard.  Then began to experience weakness and numbness in his RIGHT lower extremity.    Subjective: 12/13 afebrile overnight A/O x4, states feels as if his left lower extremity somewhat improved.  No history of gout. States that in the past he was initially diagnosed with multiple sclerosis started on long-term   Assessment & Plan: Covid vaccination; vaccinated   Active Problems:   AKI (acute kidney injury) (HCC)   Lower extremity weakness   Hyponatremia   Narcolepsy   Avascular necrosis (HCC)   Type 2 diabetes mellitus without complication (HCC)   Rhabdomyolysis   Weakness of left lower extremity   Acute RIGHT lower extremity weakness/numbness -Patient with pain to palpation over L4-S1 RIGHT spinal process, appears to be hematoma present. -Patient appears to have true  weakness/numbness on neuro exam, not from pain from hematoma. -CT L-spine no evidence of disc herniation.  See results below  -12/12 DC Solu-Medrol 60 mg BID  Hx of avascular necrosis -12/12 per patient earlier in his life he was treated for presumed multiple sclerosis with long-term steroids.  Developed bilateral avascular necrosis.  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  DM type II without complication -Patient on Metformin at home.  (Held) -12/11 Hemoglobin A1c = 5.4  -12/11 LDL= 66 -12/12 decrease Sensitive SSI  Hyponatremia -Most likely secondary to rhabdomyolysis/dehydration.  Should correct with aggressive fluid hydration. -See rhabdomyolysis Lab Results  Component Value Date   NA 128 (L) 10/17/2020   NA 125 (L) 10/16/2020   NA 123 (L) 10/15/2020   NA 133 (L) 08/15/2020   NA 137 02/07/2020    Rhabdomyolysis -Rapid urine tox screen pending CK from [facility] just reads> 50,000 -CK pending we will trend -Bolus 1 L normal saline----> normal saline 134ml/hr -12/12 increase normal saline 225ml/hr -12/12 urine tox screen negative Lab Results  Component Value Date   CKTOTAL 66,457 (H) 10/15/2020   CKTOTAL >50,000 (H) 10/15/2020   AKI -Most likely secondary to rhabdomyolysis -Hold all nephrotoxic medication -Strict in and out +2.6 L -Daily weight Lab Results  Component Value Date   CREATININE 7.82 (H) 10/17/2020   CREATININE 5.61 (H) 10/16/2020   CREATININE 4.95 (H) 10/15/2020   CREATININE 3.86 (H) 10/15/2020  CREATININE 1.20 08/15/2020   Gout 12/12 uric acid= 12 -12/13 Prednisone 50 mg daily  Hyperkalemia -12/13 Lokelma 10 g x2 dose    DVT prophylaxis: Subcu heparin Code Status: Full Family Communication:  Status is: Inpatient    Dispo: The patient is from: Home              Anticipated d/c is to: Home               Anticipated d/c date is: 12/19              Patient currently unstable      Consultants:  Nephrology   Procedures/Significant Events:  12/11 CT L-spine W0 contrast;Mild left convex lumbar scoliosis. -No acute bony findings or lumbar disc protrusions, spinal or foraminal stenosis. 12/11 US renal;Mild increase in echogenicity of the bilateral kidneys as can be seen in medical renal disease. No other acute finding. -Incidentally noted moderate amount of sludge in the gallbladder.No gallbladder wall thickening   I have personally reviewed and interpreted all radiology studies and my findings are as above.  VENTILATOR SETTINGS:    Cultures   Antimicrobials:    Devices    LINES / TUBES:      Continuous Infusions: . sodium chloride 100 mL/hr at 10/17/20 0414     Objective: Vitals:   10/16/20 0520 10/16/20 1439 10/16/20 2125 10/17/20 0439  BP: 121/70 118/70 112/66 109/69  Pulse: (!) 58 (!) 59 (!) 59 (!) 51  Resp: 16 16 16 16   Temp: 98.3 F (36.8 C) 98.3 F (36.8 C) 99 F (37.2 C) 98.5 F (36.9 C)  TempSrc: Oral Oral Oral Oral  SpO2: 97% 99% 97% 98%  Weight:      Height:        Intake/Output Summary (Last 24 hours) at 10/17/2020 10/19/2020 Last data filed at 10/17/2020 10/19/2020 Gross per 24 hour  Intake 1010 ml  Output 175 ml  Net 835 ml   Filed Weights   10/15/20 1143  Weight: 65.8 kg    Examination:  General: A/O x4 No acute respiratory distress Eyes: negative scleral hemorrhage, negative anisocoria, negative icterus ENT: Negative Runny nose, negative gingival bleeding, Neck:  Negative scars, masses, torticollis, lymphadenopathy, JVD Lungs: Clear to auscultation bilaterally without wheezes or crackles Cardiovascular: Regular rate and rhythm without murmur gallop or rub normal S1 and S2 Abdomen: Positive abdominal pain over his bladder, nondistended, positive soft, bowel sounds, no rebound, no ascites, no appreciable mass Extremities: No significant  cyanosis, clubbing, or edema bilateral lower extremities Skin: Negative rashes, lesions, ulcers Psychiatric:  Negative depression, negative anxiety, negative fatigue, negative mania  Central nervous system:  Cranial nerves II through XII intact, tongue/uvula midline, all extremities muscle strength 5/5 except RIGHT lower extremity.  RIGHT lower extremity strength 4/5, sensation intact throughout, except RIGHT lower extremity.  RIGHT lower extremity decreased sensation to pinprick and soft touch from hip to toes.  negative dysarthria, negative expressive aphasia, negative receptive aphasia.  NOTE weakness seems mildly improved .     Data Reviewed: Care during the described time interval was provided by me .  I have reviewed this patient's available data, including medical history, events of note, physical examination, and all test results as part of my evaluation.  CBC: Recent Labs  Lab 10/15/20 1325 10/15/20 2155 10/16/20 0544 10/17/20 0518  WBC 13.2* 9.4 6.7 11.7*  NEUTROABS  --   --  5.9 9.7*  HGB 12.1* 10.9* 11.5* 9.9*  HCT 34.1* 31.7* 33.8* 29.1*  MCV 84.4 84.3 85.6 85.6  PLT 171 159 166 147*   Basic Metabolic Panel: Recent Labs  Lab 10/15/20 1325 10/15/20 2155 10/16/20 0544 10/17/20 0518  NA 123*  --  125* 128*  K 4.1  --  5.2* 5.2*  CL 91*  --  93* 96*  CO2 23  --  19* 20*  GLUCOSE 99  --  129* 112*  BUN 50*  --  63* 87*  CREATININE 3.86* 4.95* 5.61* 7.82*  CALCIUM 7.6*  --  7.8* 7.7*  MG  --   --  2.5* 2.7*  PHOS  --   --  6.9* 7.3*   GFR: Estimated Creatinine Clearance: 12.2 mL/min (A) (by C-G formula based on SCr of 7.82 mg/dL (H)). Liver Function Tests: Recent Labs  Lab 10/16/20 0544 10/17/20 0518  AST 505* 248*  ALT 209* 156*  ALKPHOS 60 50  BILITOT 0.8 0.7  PROT 5.6* 5.4*  ALBUMIN 3.0* 2.9*   No results for input(s): LIPASE, AMYLASE in the last 168 hours. No results for input(s): AMMONIA in the last 168 hours. Coagulation Profile: No results for  input(s): INR, PROTIME in the last 168 hours. Cardiac Enzymes: Recent Labs  Lab 10/15/20 1325 10/15/20 2155  CKTOTAL >50,000* 66,457*   BNP (last 3 results) No results for input(s): PROBNP in the last 8760 hours. HbA1C: Recent Labs    10/15/20 2155  HGBA1C 5.4   CBG: Recent Labs  Lab 10/16/20 1536 10/16/20 2127 10/16/20 2343 10/17/20 0442 10/17/20 0816  GLUCAP 134* 116* 125* 107* 103*   Lipid Profile: Recent Labs    10/15/20 2155  CHOL 127  HDL 29*  LDLCALC 66  TRIG 703*  CHOLHDL 4.4   Thyroid Function Tests: Recent Labs    10/16/20 1704  TSH 0.199*   Anemia Panel: No results for input(s): VITAMINB12, FOLATE, FERRITIN, TIBC, IRON, RETICCTPCT in the last 72 hours. Sepsis Labs: No results for input(s): PROCALCITON, LATICACIDVEN in the last 168 hours.  Recent Results (from the past 240 hour(s))  Resp Panel by RT-PCR (Flu A&B, Covid) Nasopharyngeal Swab     Status: None   Collection Time: 10/15/20  1:57 PM   Specimen: Nasopharyngeal Swab; Nasopharyngeal(NP) swabs in vial transport medium  Result Value Ref Range Status   SARS Coronavirus 2 by RT PCR NEGATIVE NEGATIVE Final    Comment: (NOTE) SARS-CoV-2 target nucleic acids are NOT DETECTED.  The SARS-CoV-2 RNA is generally detectable in upper respiratory specimens during the acute phase of infection. The lowest concentration of SARS-CoV-2 viral copies this assay can detect is 138 copies/mL. A negative result does not preclude SARS-Cov-2 infection and should not be used as the sole basis for treatment or other patient management decisions. A negative result may occur with  improper specimen collection/handling, submission of specimen other than nasopharyngeal swab, presence of viral mutation(s) within the areas targeted by this assay, and inadequate number of viral copies(<138 copies/mL). A negative result must be combined with clinical observations, patient history, and epidemiological information. The  expected result is Negative.  Fact Sheet for Patients:  BloggerCourse.com  Fact Sheet for Healthcare Providers:  SeriousBroker.it  This test is no t yet approved or cleared by the [place] FDA and  has been authorized for detection and/or diagnosis of SARS-CoV-2 by FDA under an Emergency Use Authorization (EUA). This EUA will remain  in effect (meaning this test can be used) for the duration of the COVID-19 declaration under Section 564(b)(1) of the Act, 21 U.S.C.section 360bbb-3(b)(1), unless  the authorization is terminated  or revoked sooner.       Influenza A by PCR NEGATIVE NEGATIVE Final   Influenza B by PCR NEGATIVE NEGATIVE Final    Comment: (NOTE) The Xpert Xpress SARS-CoV-2/FLU/RSV plus assay is intended as an aid in the diagnosis of influenza from Nasopharyngeal swab specimens and should not be used as a sole basis for treatment. Nasal washings and aspirates are unacceptable for Xpert Xpress SARS-CoV-2/FLU/RSV testing.  Fact Sheet for Patients: BloggerCourse.com  Fact Sheet for Healthcare Providers: SeriousBroker.it  This test is not yet approved or cleared by the [place] FDA and has been authorized for detection and/or diagnosis of SARS-CoV-2 by FDA under an Emergency Use Authorization (EUA). This EUA will remain in effect (meaning this test can be used) for the duration of the COVID-19 declaration under Section 564(b)(1) of the Act, 21 U.S.C. section 360bbb-3(b)(1), unless the authorization is terminated or revoked.  Performed at [facility], [address] [place], [place] [address]          Radiology Studies: CT LUMBAR SPINE WO CONTRAST  Result Date: 10/15/2020 CLINICAL DATA:  Larey Seat yesterday.  Back and right leg pain. EXAM: CT LUMBAR SPINE WITHOUT CONTRAST TECHNIQUE: Multidetector CT imaging of the lumbar spine was performed  without intravenous contrast administration. Multiplanar CT image reconstructions were also generated. COMPARISON:  None available. FINDINGS: Segmentation: There are five lumbar type vertebral bodies. The last full intervertebral disc space is labeled L5-S1. Alignment: Mild left convex lumbar scoliosis. Normal alignment in the sagittal plane. Vertebrae: No fractures or bone lesions. Paraspinal and other soft tissues: No significant findings. Disc levels: No lumbar disc protrusions, spinal or foraminal stenosis. IMPRESSION: 1. Mild left convex lumbar scoliosis. 2. No acute bony findings or lumbar disc protrusions, spinal or foraminal stenosis. Electronically Signed   By: Rudie Meyer M.D.   On: 10/15/2020 18:51   US Renal  Result Date: 10/15/2020 CLINICAL DATA:  Acute renal failure. EXAM: RENAL / URINARY TRACT ULTRASOUND COMPLETE COMPARISON:  None. FINDINGS: Right Kidney: Renal measurements: 11.3 x 4.5 x 5.7 cm = volume: 151 mL. Echogenicity appears mildly increased. No mass or hydronephrosis visualized. Left Kidney: Renal measurements: 12.0 x 5.4 x 4.8 cm = volume: 164 mL. Echogenicity appears mildly increased. No mass or hydronephrosis visualized. Bladder: Appears normal for degree of bladder distention. Other: Incidentally noted moderate amount of sludge in the gallbladder. No gallbladder wall thickening. IMPRESSION: 1. Mild increase in echogenicity of the bilateral kidneys as can be seen in medical renal disease. No other acute finding. 2. Incidentally noted moderate amount of sludge in the gallbladder. No gallbladder wall thickening. Electronically Signed   By: Emmaline Kluver M.D.   On: 10/15/2020 14:46   DG CHEST PORT 1 VIEW  Result Date: 10/16/2020 CLINICAL DATA:  Shortness of breath EXAM: PORTABLE CHEST 1 VIEW COMPARISON:  Radiograph 02/07/2020 FINDINGS: No consolidation, features of edema, pneumothorax, or effusion. Pulmonary vascularity is normally distributed. The cardiomediastinal contours  are unremarkable. No acute osseous or soft tissue abnormality. IMPRESSION: No acute cardiopulmonary abnormality. Electronically Signed   By: Kreg Shropshire M.D.   On: 10/16/2020 21:31        Scheduled Meds: . Chlorhexidine Gluconate Cloth  6 each Topical Daily  . heparin  5,000 Units Subcutaneous Q8H  . influenza vac split quadrivalent PF  0.5 mL Intramuscular Tomorrow-1000  . insulin aspart  0-9 Units Subcutaneous Q4H  . pneumococcal 23 valent vaccine  0.5 mL Intramuscular Tomorrow-1000   Continuous  Infusions: . sodium chloride 100 mL/hr at 10/17/20 0414     LOS: 2 days    Time spent:40 min    Jenella Craigie, Roselind MessierURTIS J, MD [facility] Pager [phone]  If 7PM-7AM, please contact night-coverage [email or web address] Password [email or web address] 10/17/2020, 8:34 AM

## 2020-10-18 LAB — GLUCOSE, CAPILLARY
Glucose-Capillary: 116 mg/dL — ABNORMAL HIGH (ref 70–99)
Glucose-Capillary: 127 mg/dL — ABNORMAL HIGH (ref 70–99)
Glucose-Capillary: 91 mg/dL (ref 70–99)
Glucose-Capillary: 84 mg/dL (ref 70–99)
Glucose-Capillary: 130 mg/dL — ABNORMAL HIGH (ref 70–99)
Glucose-Capillary: 121 mg/dL — ABNORMAL HIGH (ref 70–99)
Glucose-Capillary: 106 mg/dL — ABNORMAL HIGH (ref 70–99)
Glucose-Capillary: 102 mg/dL — ABNORMAL HIGH (ref 70–99)

## 2020-10-18 LAB — COMPREHENSIVE METABOLIC PANEL
ALT: 112 U/L — ABNORMAL HIGH (ref 0–44)
AST: 107 U/L — ABNORMAL HIGH (ref 15–41)
Albumin: 2.6 g/dL — ABNORMAL LOW (ref 3.5–5.0)
Alkaline Phosphatase: 43 U/L (ref 38–126)
Anion gap: 13 (ref 5–15)
BUN: 99 mg/dL — ABNORMAL HIGH (ref 6–20)
CO2: 16 mmol/L — ABNORMAL LOW (ref 22–32)
Calcium: 7.5 mg/dL — ABNORMAL LOW (ref 8.9–10.3)
Chloride: 102 mmol/L (ref 98–111)
Creatinine, Ser: 9.16 mg/dL — ABNORMAL HIGH (ref 0.61–1.24)
GFR, Estimated: 7 mL/min — ABNORMAL LOW (ref >60)
Glucose, Bld: 91 mg/dL (ref 70–99)
Potassium: 4.8 mmol/L (ref 3.5–5.1)
Sodium: 131 mmol/L — ABNORMAL LOW (ref 135–145)
Total Bilirubin: 0.5 mg/dL (ref 0.3–1.2)
Total Protein: 5.1 g/dL — ABNORMAL LOW (ref 6.5–8.1)

## 2020-10-18 LAB — CBC WITH DIFFERENTIAL/PLATELET
Abs Immature Granulocytes: 0.05 10*3/uL (ref 0.00–0.07)
Basophils Absolute: 0 10*3/uL (ref 0.0–0.1)
Basophils Relative: 0 %
Eosinophils Absolute: 0 10*3/uL (ref 0.0–0.5)
Eosinophils Relative: 0 %
HCT: 28.8 % — ABNORMAL LOW (ref 39.0–52.0)
Hemoglobin: 10 g/dL — ABNORMAL LOW (ref 13.0–17.0)
Immature Granulocytes: 0 %
Lymphocytes Relative: 15 %
Lymphs Abs: 1.7 10*3/uL (ref 0.7–4.0)
MCH: 29.7 pg (ref 26.0–34.0)
MCHC: 34.7 g/dL (ref 30.0–36.0)
MCV: 85.5 fL (ref 80.0–100.0)
Monocytes Absolute: 0.9 10*3/uL (ref 0.1–1.0)
Monocytes Relative: 8 %
Neutro Abs: 9 10*3/uL — ABNORMAL HIGH (ref 1.7–7.7)
Neutrophils Relative %: 77 %
Platelets: 160 10*3/uL (ref 150–400)
RBC: 3.37 MIL/uL — ABNORMAL LOW (ref 4.22–5.81)
RDW: 13.5 % (ref 11.5–15.5)
WBC: 11.6 10*3/uL — ABNORMAL HIGH (ref 4.0–10.5)
nRBC: 0 % (ref 0.0–0.2)

## 2020-10-18 LAB — CK: Total CK: 9665 U/L — ABNORMAL HIGH (ref 49–397)

## 2020-10-18 LAB — PHOSPHORUS: Phosphorus: 7.6 mg/dL — ABNORMAL HIGH (ref 2.5–4.6)

## 2020-10-18 LAB — MAGNESIUM: Magnesium: 2.5 mg/dL — ABNORMAL HIGH (ref 1.7–2.4)

## 2020-10-18 MED ORDER — SODIUM CHLORIDE 0.9 % IV SOLN: 1000.0000 mL | INTRAVENOUS | Status: DC

## 2020-10-18 MED ORDER — SODIUM CHLORIDE 0.9 % IV SOLN
1000.0000 mL | INTRAVENOUS | Status: DC
  Administered 2020-10-18: 1000 mL via INTRAVENOUS

## 2020-10-18 NOTE — Progress Notes (Signed)
New Admission Note:   Arrival Method: stretcher Mental Orientation: alert & oriented x 4 Telemetry: # 12 NSR Assessment:  Skin: dry and intact  IV: NSL right AC  Pain: 6/10  Tubes: none Safety Measures: Safety Fall Prevention Plan has been given, discussed and signed Admission: Completed [facility] Orientation: Patient has been orientated to the room, unit and staff.  Family: not available   Orders have been reviewed and implemented. Will continue to monitor the patient. Call light has been placed within reach and bed alarm has been activated.   Katrina Stack, RN

## 2020-10-18 NOTE — Progress Notes (Addendum)
[facility] Progress Note  Subjective: seen in room, no N/V, no jerking or confusion. Creat up to 9. UOP 150 yest and 250 so far today.   Vitals:   10/17/20 0439 10/17/20 1349 10/17/20 2135 10/18/20 0415  BP: 109/69 118/71 113/66 112/66  Pulse: (!) 51 (!) 49 (!) 56 (!) 56  Resp: 16 16 16 18   Temp: 98.5 F (36.9 C) 98.1 F (36.7 C) 98.4 F (36.9 C) 98.3 F (36.8 C)  TempSrc: Oral Oral Oral Oral  SpO2: 98% 98% 96% 95%  Weight:      Height:        Exam:   alert, nad   no jvd  Chest cta bilat  Cor reg no RG  Abd soft ntnd no ascites   Ext no LE edema   Alert, NF, ox3  UA - prot 30, 0-5 rbc, large Hb, 6-10 wbc   Renal - IMPRESSION: Mild increase in echogenicity of the bilateral kidneys as can be seen in medical renal disease. No other acute finding.  Assessment/ Plan: 1. AKI - due to ATN oliguric due to acute rhabdomyolysis w/ CPK > 50,000. Renal US unremarkable. UA large Hb w/ small RBC's and wbc's.  Volume status acceptable at the moment but don't want to overload patient, will lower to 75 cc/hr. No need for RRT yet today, remains w/o uremic symptoms. Awaiting Tx to [facility]. Will follow closely.  2. Acute rhabdomyolysis - occurred shortly after COVID vaccine and there are case reports of acute rhabdo after COVID vaccination. This is the likely cause and repeat vaccination may need to be avoided or at least would recommend curbside ID on what he should do when next vaccine is due.  3. Hyponatremia - better at 128 today.  4. Hyperkalemia - due to tissue damage, AKI.  5. Metabolic acidosis - due to tissue damage, AKI 6. Transaminitis - rhabdo will cause ^^ AST / ALT that looks like liver damage but is really due to muscle damage     Rob Klaire Court 10/18/2020, 1:07 PM   Recent Labs  Lab 10/17/20 0518 10/18/20 0532  K 5.2* 4.8  BUN 87* 99*  CREATININE 7.82* 9.16*  CALCIUM 7.7* 7.5*  PHOS 7.3* 7.6*  HGB 9.9* 10.0*   Inpatient medications: . Chlorhexidine  Gluconate Cloth  6 each Topical Daily  . heparin  5,000 Units Subcutaneous Q8H  . insulin aspart  0-9 Units Subcutaneous Q4H  . predniSONE  50 mg Oral Q breakfast  . sodium zirconium cyclosilicate  10 g Oral BID   . sodium chloride 65 mL/hr at 10/18/20 1250   acetaminophen, fentaNYL (SUBLIMAZE) injection, influenza vac split quadrivalent PF, pneumococcal 23 valent vaccine, traMADol

## 2020-10-18 NOTE — Progress Notes (Signed)
PROGRESS NOTE    William CablesJoshua Quinn  WUJ:[id number] DOB: August 07, 1986 DOA: 10/15/2020 PCP: Patient, No Pcp Per     Brief Narrative:  William Quinn is a 34 yo WM  PMHx Avascular necrosis, Narcolepsy, DM type II  presents to the ED for evaluation of numbness of his right leg. Patient states the symptoms started yesterday. He feels like his whole right leg is having pins-and-needles. Its not on one side versus the other. Patient also has some mild discomfort with it but is not really having any significant pain. He denies any back pain. Patient states his foot also feels cold  Patient's numbness is in a stocking type pattern. Argues against lumbar radiculopathy however the unilateral nature is atypical for some type of metabolic cause for his numbness. Did check laboratory test to assess for any electrolyte abnormalities and the patient does have evidence of acute kidney injury with elevated BUN and creatinine. Patient is also hyponatremic and this is all new from 2 months ago. I have ordered a renal ultrasound. I will also add on a CK.  Patient states that yesterday he fell on a tile floor fairly hard.  Then began to experience weakness and numbness in his RIGHT lower extremity.    Subjective: 12/14 febrile overnight A/O x4, states his RIGHT lower leg feeling a little bit improved.    Assessment & Plan: Covid vaccination; vaccinated   Active Problems:   AKI (acute kidney injury) (HCC)   Lower extremity weakness   Hyponatremia   Narcolepsy   Avascular necrosis (HCC)   Type 2 diabetes mellitus without complication (HCC)   Rhabdomyolysis   Weakness of left lower extremity   Acute RIGHT lower extremity weakness/numbness -Patient with pain to palpation over L4-S1 RIGHT spinal process, appears to be hematoma present. -Patient appears to have true weakness/numbness on neuro exam, not from pain from hematoma. -CT L-spine no evidence of disc herniation.  See results below   -12/12 DC Solu-Medrol 60 mg BID  Hx of avascular necrosis -12/12 per patient earlier in his life he was treated for presumed multiple sclerosis with long-term steroids.  Developed bilateral avascular necrosis.  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  DM type II without complication -Patient on Metformin at home.  (Held) -12/11 Hemoglobin A1c = 5.4  -12/11 LDL= 66 -12/12 decrease Sensitive SSI  Hyponatremia -Most likely secondary to rhabdomyolysis/dehydration.  Should correct with aggressive fluid hydration. -See rhabdomyolysis Lab Results  Component Value Date   NA 131 (L) 10/18/2020   NA 128 (L) 10/17/2020   NA 125 (L) 10/16/2020   NA 123 (L) 10/15/2020   NA 133 (L) 08/15/2020    Rhabdomyolysis -[facility]apid [facility] tox [facility] just reads> 50,000 -CK pending we will trend -Bolus 1 L normal saline----> normal saline 15150ml/hr -12/12 increase normal saline 28900ml/hr -12/12 urine tox screen negative Lab Results  Component Value Date   CKTOTAL 9,665 (H) 10/18/2020   CKTOTAL 95,62123,778 (H) 10/17/2020   CKTOTAL 30,86566,457 (H) 10/15/2020   CKTOTAL >50,000 (H) 10/15/2020   AKI -Most likely secondary to rhabdomyolysis -Hold all nephrotoxic medication -Strict in and out +2.8L -Daily weight Lab Results  Component Value Date   CREATININE 9.16 (H) 10/18/2020   CREATININE 7.82 (H) 10/17/2020   CREATININE 5.61 (H) 10/16/2020   CREATININE 4.95 (H) 10/15/2020   CREATININE 3.86 (H) 10/15/2020   Gout?  12/12 uric acid= 12 -12/13 Prednisone 50 mg daily  Hyperkalemia -12/13 Lokelma 10 g x2 dose    DVT prophylaxis: Subcu heparin Code Status: Full Family Communication:  Status is: Inpatient    Dispo: The patient is from: Home              Anticipated d/c is to: Home              Anticipated d/c date is: 12/19              Patient  currently unstable      Consultants:  Nephrology   Procedures/Significant Events:  12/11 CT L-spine W0 contrast;Mild left convex lumbar scoliosis. -No acute bony findings or lumbar disc protrusions, spinal or foraminal stenosis. 12/11 US renal;Mild increase in echogenicity of the bilateral kidneys as can be seen in medical renal disease. No other acute finding. -Incidentally noted moderate amount of sludge in the gallbladder.No gallbladder wall thickening   I have personally reviewed and interpreted all radiology studies and my findings are as above.  VENTILATOR SETTINGS:    Cultures   Antimicrobials:    Devices    LINES / TUBES:      Continuous Infusions: . sodium chloride 1,000 mL (10/18/20 1121)     Objective: Vitals:   10/17/20 0439 10/17/20 1349 10/17/20 2135 10/18/20 0415  BP: 109/69 118/71 113/66 112/66  Pulse: (!) 51 (!) 49 (!) 56 (!) 56  Resp: 16 16 16 18   Temp: 98.5 F (36.9 C) 98.1 F (36.7 C) 98.4 F (36.9 C) 98.3 F (36.8 C)  TempSrc: Oral Oral Oral Oral  SpO2: 98% 98% 96% 95%  Weight:      Height:        Intake/Output Summary (Last 24 hours) at 10/18/2020 1145 Last data filed at 10/18/2020 0417 Gross per 24 hour  Intake 240 ml  Output 250 ml  Net -10 ml   Filed Weights   10/15/20 1143  Weight: 65.8 kg    Examination:  General: A/O x4 No acute respiratory distress Eyes: negative scleral hemorrhage, negative anisocoria, negative icterus ENT: Negative Runny nose, negative gingival bleeding, Neck:  Negative scars, masses, torticollis, lymphadenopathy, JVD Lungs: Clear to auscultation bilaterally without wheezes or crackles Cardiovascular: Regular rate and rhythm without murmur gallop or rub normal S1 and S2 Abdomen: Positive abdominal pain over his bladder, nondistended, positive soft, bowel sounds, no rebound, no ascites, no appreciable mass Extremities: No significant cyanosis, clubbing, or edema bilateral lower  extremities Skin: Negative rashes, lesions, ulcers Psychiatric:  Negative depression, negative anxiety, negative fatigue, negative mania  Central nervous system:  Cranial nerves II through XII intact, tongue/uvula midline, all extremities muscle strength 5/5 except RIGHT lower extremity.  RIGHT lower extremity strength 4/5, sensation intact throughout, except RIGHT lower extremity.  RIGHT lower extremity decreased sensation to pinprick and soft touch from hip to toes.  negative dysarthria, negative expressive aphasia, negative receptive aphasia.  NOTE weakness seems mildly improved .     Data Reviewed: Care during the described time interval was provided by me .  I have reviewed this patient's available data, including medical history, events of note, physical examination, and all test results as part of my evaluation.  CBC: Recent Labs  Lab 10/15/20 1325 10/15/20 2155 10/16/20 0544 10/17/20 0518 10/18/20 0532  WBC 13.2* 9.4 6.7 11.7* 11.6*  NEUTROABS  --   --  5.9 9.7* 9.0*  HGB 12.1* 10.9* 11.5* 9.9* 10.0*  HCT 34.1* 31.7* 33.8* 29.1* 28.8*  MCV  84.4 84.3 85.6 85.6 85.5  PLT 171 159 166 147* 160   Basic Metabolic Panel: Recent Labs  Lab 10/15/20 1325 10/15/20 2155 10/16/20 0544 10/17/20 0518 10/18/20 0532  NA 123*  --  125* 128* 131*  K 4.1  --  5.2* 5.2* 4.8  CL 91*  --  93* 96* 102  CO2 23  --  19* 20* 16*  GLUCOSE 99  --  129* 112* 91  BUN 50*  --  63* 87* 99*  CREATININE 3.86* 4.95* 5.61* 7.82* 9.16*  CALCIUM 7.6*  --  7.8* 7.7* 7.5*  MG  --   --  2.5* 2.7* 2.5*  PHOS  --   --  6.9* 7.3* 7.6*   GFR: Estimated Creatinine Clearance: 10.4 mL/min (A) (by C-G formula based on SCr of 9.16 mg/dL (H)). Liver Function Tests: Recent Labs  Lab 10/16/20 0544 10/17/20 0518 10/18/20 0532  AST 505* 248* 107*  ALT 209* 156* 112*  ALKPHOS 60 50 43  BILITOT 0.8 0.7 0.5  PROT 5.6* 5.4* 5.1*  ALBUMIN 3.0* 2.9* 2.6*   No results for input(s): LIPASE, AMYLASE in the last  168 hours. No results for input(s): AMMONIA in the last 168 hours. Coagulation Profile: No results for input(s): INR, PROTIME in the last 168 hours. Cardiac Enzymes: Recent Labs  Lab 10/15/20 1325 10/15/20 2155 10/17/20 0518 10/18/20 0532  CKTOTAL >50,000* 50,093* 23,778* 9,665*   BNP (last 3 results) No results for input(s): PROBNP in the last 8760 hours. HbA1C: Recent Labs    10/15/20 2155  HGBA1C 5.4   CBG: Recent Labs  Lab 10/17/20 1658 10/17/20 1947 10/17/20 2348 10/18/20 0412 10/18/20 0738  GLUCAP 128* 127* 116* 91 84   Lipid Profile: Recent Labs    10/15/20 2155  CHOL 127  HDL 29*  LDLCALC 66  TRIG 818*  CHOLHDL 4.4   Thyroid Function Tests: Recent Labs    10/16/20 1704  TSH 0.199*   Anemia Panel: No results for input(s): VITAMINB12, FOLATE, FERRITIN, TIBC, IRON, RETICCTPCT in the last 72 hours. Sepsis Labs: No results for input(s): PROCALCITON, LATICACIDVEN in the last 168 hours.  Recent Results (from the past 240 hour(s))  Resp Panel by RT-PCR (Flu A&B, Covid) Nasopharyngeal Swab     Status: None   Collection Time: 10/15/20  1:57 PM   Specimen: Nasopharyngeal Swab; Nasopharyngeal(NP) swabs in vial transport medium  Result Value Ref Range Status   SARS Coronavirus 2 by RT PCR NEGATIVE NEGATIVE Final    Comment: (NOTE) SARS-CoV-2 target nucleic acids are NOT DETECTED.  The SARS-CoV-2 RNA is generally detectable in upper respiratory specimens during the acute phase of infection. The lowest concentration of SARS-CoV-2 viral copies this assay can detect is 138 copies/mL. A negative result does not preclude SARS-Cov-2 infection and should not be used as the sole basis for treatment or other patient management decisions. A negative result may occur with  improper specimen collection/handling, submission of specimen other than nasopharyngeal swab, presence of viral mutation(s) within the areas targeted by this assay, and inadequate number of  viral copies(<138 copies/mL). A negative result must be combined with clinical observations, patient history, and epidemiological information. The expected result is Negative.  Fact Sheet for Patients:  BloggerCourse.com  Fact Sheet for Healthcare Providers:  SeriousBroker.it  This test is no t yet approved or cleared by the [place] FDA and  has been authorized for detection and/or diagnosis of SARS-CoV-2 by FDA under an Emergency Use Authorization (EUA). This EUA will  remain  in effect (meaning this test can be used) for the duration of the COVID-19 declaration under Section 564(b)(1) of the Act, 21 U.S.C.section 360bbb-3(b)(1), unless the authorization is terminated  or revoked sooner.       Influenza A by PCR NEGATIVE NEGATIVE Final   Influenza B by PCR NEGATIVE NEGATIVE Final    Comment: (NOTE) The Xpert Xpress SARS-CoV-2/FLU/RSV plus assay is intended as an aid in the diagnosis of influenza from Nasopharyngeal swab specimens and should not be used as a sole basis for treatment. Nasal washings and aspirates are unacceptable for Xpert Xpress SARS-CoV-2/FLU/RSV testing.  Fact Sheet for Patients: BloggerCourse.com  Fact Sheet for Healthcare Providers: SeriousBroker.it  This test is not yet approved or cleared by the [place] FDA and has been authorized for detection and/or diagnosis of SARS-CoV-2 by FDA under an Emergency Use Authorization (EUA). This EUA will remain in effect (meaning this test can be used) for the duration of the COVID-19 declaration under Section 564(b)(1) of the Act, 21 U.S.C. section 360bbb-3(b)(1), unless the authorization is terminated or revoked.  Performed at [facility], [address], [place], [place] [address]          Radiology Studies: DG CHEST PORT 1 VIEW  Result Date: 10/16/2020 CLINICAL DATA:  Shortness of  breath EXAM: PORTABLE CHEST 1 VIEW COMPARISON:  Radiograph 02/07/2020 FINDINGS: No consolidation, features of edema, pneumothorax, or effusion. Pulmonary vascularity is normally distributed. The cardiomediastinal contours are unremarkable. No acute osseous or soft tissue abnormality. IMPRESSION: No acute cardiopulmonary abnormality. Electronically Signed   By: Kreg Shropshire M.D.   On: 10/16/2020 21:31        Scheduled Meds: . Chlorhexidine Gluconate Cloth  6 each Topical Daily  . heparin  5,000 Units Subcutaneous Q8H  . insulin aspart  0-9 Units Subcutaneous Q4H  . predniSONE  50 mg Oral Q breakfast  . sodium zirconium cyclosilicate  10 g Oral BID   Continuous Infusions: . sodium chloride 1,000 mL (10/18/20 1121)     LOS: 3 days    Time spent:40 min    Cella Cappello, Roselind Messier, MD [facility] Pager [phone]  If 7PM-7AM, please contact night-coverage [email or web address] Password [email or web address] 10/18/2020, 11:45 AM

## 2020-10-18 NOTE — Progress Notes (Signed)
Report given to [facility] and [facility] for transfer.  Pt given all belongings.  Pt's foley intact for transfer.

## 2020-10-19 LAB — COMPREHENSIVE METABOLIC PANEL
ALT: 89 U/L — ABNORMAL HIGH (ref 0–44)
AST: 68 U/L — ABNORMAL HIGH (ref 15–41)
Albumin: 2.3 g/dL — ABNORMAL LOW (ref 3.5–5.0)
Alkaline Phosphatase: 44 U/L (ref 38–126)
Anion gap: 12 (ref 5–15)
BUN: 109 mg/dL — ABNORMAL HIGH (ref 6–20)
CO2: 17 mmol/L — ABNORMAL LOW (ref 22–32)
Calcium: 7.7 mg/dL — ABNORMAL LOW (ref 8.9–10.3)
Chloride: 103 mmol/L (ref 98–111)
Creatinine, Ser: 10.62 mg/dL — ABNORMAL HIGH (ref 0.61–1.24)
GFR, Estimated: 6 mL/min — ABNORMAL LOW (ref >60)
Glucose, Bld: 115 mg/dL — ABNORMAL HIGH (ref 70–99)
Potassium: 4.5 mmol/L (ref 3.5–5.1)
Sodium: 132 mmol/L — ABNORMAL LOW (ref 135–145)
Total Bilirubin: 0.8 mg/dL (ref 0.3–1.2)
Total Protein: 4.7 g/dL — ABNORMAL LOW (ref 6.5–8.1)

## 2020-10-19 LAB — CBC WITH DIFFERENTIAL/PLATELET
Abs Immature Granulocytes: 0.07 10*3/uL (ref 0.00–0.07)
Basophils Absolute: 0 10*3/uL (ref 0.0–0.1)
Basophils Relative: 0 %
Eosinophils Absolute: 0 10*3/uL (ref 0.0–0.5)
Eosinophils Relative: 0 %
HCT: 29.7 % — ABNORMAL LOW (ref 39.0–52.0)
Hemoglobin: 10.8 g/dL — ABNORMAL LOW (ref 13.0–17.0)
Immature Granulocytes: 1 %
Lymphocytes Relative: 11 %
Lymphs Abs: 1.1 10*3/uL (ref 0.7–4.0)
MCH: 29.8 pg (ref 26.0–34.0)
MCHC: 36.4 g/dL — ABNORMAL HIGH (ref 30.0–36.0)
MCV: 82 fL (ref 80.0–100.0)
Monocytes Absolute: 0.8 10*3/uL (ref 0.1–1.0)
Monocytes Relative: 8 %
Neutro Abs: 8.4 10*3/uL — ABNORMAL HIGH (ref 1.7–7.7)
Neutrophils Relative %: 80 %
Platelets: 200 10*3/uL (ref 150–400)
RBC: 3.62 MIL/uL — ABNORMAL LOW (ref 4.22–5.81)
RDW: 13.2 % (ref 11.5–15.5)
WBC: 10.4 10*3/uL (ref 4.0–10.5)
nRBC: 0 % (ref 0.0–0.2)

## 2020-10-19 LAB — GLUCOSE, CAPILLARY
Glucose-Capillary: 106 mg/dL — ABNORMAL HIGH (ref 70–99)
Glucose-Capillary: 130 mg/dL — ABNORMAL HIGH (ref 70–99)
Glucose-Capillary: 145 mg/dL — ABNORMAL HIGH (ref 70–99)
Glucose-Capillary: 155 mg/dL — ABNORMAL HIGH (ref 70–99)
Glucose-Capillary: 80 mg/dL (ref 70–99)
Glucose-Capillary: 94 mg/dL (ref 70–99)

## 2020-10-19 LAB — MAGNESIUM: Magnesium: 2.3 mg/dL (ref 1.7–2.4)

## 2020-10-19 LAB — T4, FREE: Free T4: 0.61 ng/dL (ref 0.61–1.12)

## 2020-10-19 LAB — VITAMIN B12: Vitamin B-12: 250 pg/mL (ref 180–914)

## 2020-10-19 LAB — CK: Total CK: 5481 U/L — ABNORMAL HIGH (ref 49–397)

## 2020-10-19 LAB — PHOSPHORUS: Phosphorus: 7.4 mg/dL — ABNORMAL HIGH (ref 2.5–4.6)

## 2020-10-19 MED ORDER — STERILE WATER FOR INJECTION IV SOLN
INTRAVENOUS | Status: DC
  Filled 2020-10-19 (×3): qty 850

## 2020-10-19 NOTE — Progress Notes (Signed)
[facility] Progress Note  Subjective: states is more sleepy today and minimal nausea which is new, no jerking or confusion. UOP yest 650 cc which is new.   Vitals:   10/18/20 1859 10/18/20 2016 10/19/20 0445 10/19/20 0901  BP: 122/75 127/74 115/70 123/76  Pulse: (!) 52 (!) 50 (!) 49 (!) 50  Resp: 18 18 17 18   Temp: 98.9 F (37.2 C) 98.5 F (36.9 C) 98.4 F (36.9 C) 97.7 F (36.5 C)  TempSrc: Oral Oral Oral Oral  SpO2: 97% 97% 96% 96%  Weight:      Height:        Exam:   alert, nad   no jvd  Chest cta bilat  Cor reg no RG  Abd soft ntnd no ascites   Ext no LE edema   Alert, NF, ox3  UA - prot 30, 0-5 rbc, large Hb, 6-10 wbc   Renal - IMPRESSION: Mild increase in echogenicity of the bilateral kidneys as can be seen in medical renal disease. No other acute finding.  Assessment/ Plan: 1. AKI - due to ATN oliguric due to acute rhabdomyolysis w/ CPK > 50,000. Renal US unremarkable. UA large Hb w/ small RBC's and wbc's. CReat 5.6 on admit > 7.8 > 9.1 > 10.6 today. Pt w/ new but mild uremic symptoms (lethargy, nausea).  UOP improving and creat may be leveling off. Will try to get by without dialysis. F/u labs and symptoms in am tomorrow.  2. Acute rhabdomyolysis - suspect this is complication of COVID vaccination.  There are case reports of acute rhabdo after COVID vaccination.  Improving.  3. Hyperkalemia - resolved 4. Transaminitis - rhabdo will cause ^^ AST / ALT that looks like liver damage but is really due to muscle damage     Rob Gwynevere Lizana 10/19/2020, 3:28 PM   Recent Labs  Lab 10/18/20 0532 10/19/20 0222  K 4.8 4.5  BUN 99* 109*  CREATININE 9.16* 10.62*  CALCIUM 7.5* 7.7*  PHOS 7.6* 7.4*  HGB 10.0* 10.8*   Inpatient medications: . Chlorhexidine Gluconate Cloth  6 each Topical Daily  . heparin  5,000 Units Subcutaneous Q8H  . insulin aspart  0-9 Units Subcutaneous Q4H  . predniSONE  50 mg Oral Q breakfast  . sodium zirconium cyclosilicate   10 g Oral BID   . sodium chloride 1,000 mL (10/18/20 1933)   acetaminophen, fentaNYL (SUBLIMAZE) injection, influenza vac split quadrivalent PF, pneumococcal 23 valent vaccine, traMADol

## 2020-10-19 NOTE — Progress Notes (Signed)
Patient had valuable belonging folder with seal unbroken.  This is belongings that were in Security's safe at [facility] and that we released to patient upon transfer to [facility].  Per patient, he wants the package containing his valuables (cash, wallet, credit and debit cards, etc) placed in [facility] safe.  The sealed folder was placed in another folder and taken to the Security Office to put in the safe.  The receipt was placed in the Shadow Chart.  Bernie Covey RN

## 2020-10-19 NOTE — Progress Notes (Signed)
PROGRESS NOTE    William Quinn  OEU:[id number] DOB: 02/14/1986 DOA: 10/15/2020 PCP: Patient, No Pcp Per   Brief Narrative: 34 year old with past medical history significant for avascular necrosis, narcolepsy, type 2 diabetes who presented to the ED for evaluation of numbness of his right leg.  Patient reported his symptoms started the day prior to admission.  He report having right lower extremity pain numbness and tingling.  Patient numbness is in a stocking type pattern.  Patient was found to have acute kidney injury with elevated BUN and creatinine. He was found to have also rhabdomyolysis with CK more than 50,000.  Assessment & Plan:   Active Problems:   AKI (acute kidney injury) (HCC)   Lower extremity weakness   Hyponatremia   Narcolepsy   Avascular necrosis (HCC)   Type 2 diabetes mellitus without complication (HCC)   Rhabdomyolysis   Weakness of left lower extremity  1-AKI probably related to rhabdomyolysis: Metabolic acidosis  Nephrology is following. Only urine renal failure secondary to rhabdomyolysis. For hemodialysis today per nephrology.  Acute right lower extremity weakness numbness: CT lumbar spine no evidence of disc herniation. He was a started on Solu-Medrol.  This was discontinued. I will check TSH, free T3 free T4 and B12 level  History of a vascular necrosis: Presumably he was treated with long-term steroid for presumed multiple sclerosis.  In 2015 or 16 it was decided that he is did not have multiple sclerosis and oral steroids were discontinued.  Diabetes  type II: Continue with sliding scale insulin Continue to hold Metformin.  Hyponatremia: Continue with IV fluids  Questionable gout: Uric acid at 12: On prednisone daily now  Hyperkalemia: Received Lokelma Resolved.    Estimated body mass index is 22.05 kg/m as calculated from the following:   Height as of this encounter: 5\' 8"  (1.727 m).   Weight as of this encounter: 65.8 kg.   DVT  prophylaxis: Heparin Code Status: Full code Family Communication: Care discussed with patient Disposition Plan:  Status is: Inpatient  Remains inpatient appropriate because:IV treatments appropriate due to intensity of illness or inability to take PO   Dispo: The patient is from: Home              Anticipated d/c is to: Home              Anticipated d/c date is: 3 days              Patient currently is not medically stable to d/c.        Consultants:   Nephrology  Procedures:   None  Antimicrobials:    Subjective: Patient is a still complaining of right lower extremity pain and tingling  Objective: Vitals:   10/18/20 2016 10/19/20 0445 10/19/20 0901 10/19/20 1644  BP: 127/74 115/70 123/76 126/70  Pulse: (!) 50 (!) 49 (!) 50 (!) 50  Resp: 18 17 18 18   Temp: 98.5 F (36.9 C) 98.4 F (36.9 C) 97.7 F (36.5 C) 98.5 F (36.9 C)  TempSrc: Oral Oral Oral Oral  SpO2: 97% 96% 96% 96%  Weight:      Height:        Intake/Output Summary (Last 24 hours) at 10/19/2020 1751 Last data filed at 10/19/2020 1524 Gross per 24 hour  Intake 2736.39 ml  Output 600 ml  Net 2136.39 ml   Filed Weights   10/15/20 1143  Weight: 65.8 kg    Examination:  General exam: Appears calm and comfortable  Respiratory system: Clear  to auscultation. Respiratory effort normal. Cardiovascular system: S1 & S2 heard, RRR. No JVD, murmurs, rubs, gallops or clicks. No pedal edema. Gastrointestinal system: Abdomen is nondistended, soft and nontender. No organomegaly or masses felt. Normal bowel sounds heard. Central nervous system: Alert and oriented. No focal neurological deficits. Extremities: Symmetric 5 x 5 power.    Data Reviewed: I have personally reviewed following labs and imaging studies  CBC: Recent Labs  Lab 10/15/20 2155 10/16/20 0544 10/17/20 0518 10/18/20 0532 10/19/20 0222  WBC 9.4 6.7 11.7* 11.6* 10.4  NEUTROABS  --  5.9 9.7* 9.0* 8.4*  HGB 10.9* 11.5* 9.9*  10.0* 10.8*  HCT 31.7* 33.8* 29.1* 28.8* 29.7*  MCV 84.3 85.6 85.6 85.5 82.0  PLT 159 166 147* 160 200   Basic Metabolic Panel: Recent Labs  Lab 10/15/20 1325 10/15/20 2155 10/16/20 0544 10/17/20 0518 10/18/20 0532 10/19/20 0222  NA 123*  --  125* 128* 131* 132*  K 4.1  --  5.2* 5.2* 4.8 4.5  CL 91*  --  93* 96* 102 103  CO2 23  --  19* 20* 16* 17*  GLUCOSE 99  --  129* 112* 91 115*  BUN 50*  --  63* 87* 99* 109*  CREATININE 3.86* 4.95* 5.61* 7.82* 9.16* 10.62*  CALCIUM 7.6*  --  7.8* 7.7* 7.5* 7.7*  MG  --   --  2.5* 2.7* 2.5* 2.3  PHOS  --   --  6.9* 7.3* 7.6* 7.4*   GFR: Estimated Creatinine Clearance: 8.9 mL/min (A) (by C-G formula based on SCr of 10.62 mg/dL (H)). Liver Function Tests: Recent Labs  Lab 10/16/20 0544 10/17/20 0518 10/18/20 0532 10/19/20 0222  AST 505* 248* 107* 68*  ALT 209* 156* 112* 89*  ALKPHOS 60 50 43 44  BILITOT 0.8 0.7 0.5 0.8  PROT 5.6* 5.4* 5.1* 4.7*  ALBUMIN 3.0* 2.9* 2.6* 2.3*   No results for input(s): LIPASE, AMYLASE in the last 168 hours. No results for input(s): AMMONIA in the last 168 hours. Coagulation Profile: No results for input(s): INR, PROTIME in the last 168 hours. Cardiac Enzymes: Recent Labs  Lab 10/15/20 1325 10/15/20 2155 10/17/20 0518 10/18/20 0532 10/19/20 0222  CKTOTAL >50,000* 65,035* 23,778* 9,665* 5,481*   BNP (last 3 results) No results for input(s): PROBNP in the last 8760 hours. HbA1C: No results for input(s): HGBA1C in the last 72 hours. CBG: Recent Labs  Lab 10/19/20 0004 10/19/20 0447 10/19/20 0740 10/19/20 1116 10/19/20 1643  GLUCAP 130* 94 80 106* 155*   Lipid Profile: No results for input(s): CHOL, HDL, LDLCALC, TRIG, CHOLHDL, LDLDIRECT in the last 72 hours. Thyroid Function Tests: Recent Labs    10/19/20 1151  FREET4 0.61   Anemia Panel: Recent Labs    10/19/20 1151  VITAMINB12 250   Sepsis Labs: No results for input(s): PROCALCITON, LATICACIDVEN in the last 168  hours.  Recent Results (from the past 240 hour(s))  Resp Panel by RT-PCR (Flu A&B, Covid) Nasopharyngeal Swab     Status: None   Collection Time: 10/15/20  1:57 PM   Specimen: Nasopharyngeal Swab; Nasopharyngeal(NP) swabs in vial transport medium  Result Value Ref Range Status   SARS Coronavirus 2 by RT PCR NEGATIVE NEGATIVE Final    Comment: (NOTE) SARS-CoV-2 target nucleic acids are NOT DETECTED.  The SARS-CoV-2 RNA is generally detectable in upper respiratory specimens during the acute phase of infection. The lowest concentration of SARS-CoV-2 viral copies this assay can detect is 138 copies/mL. A negative result does  not preclude SARS-Cov-2 infection and should not be used as the sole basis for treatment or other patient management decisions. A negative result may occur with  improper specimen collection/handling, submission of specimen other than nasopharyngeal swab, presence of viral mutation(s) within the areas targeted by this assay, and inadequate number of viral copies(<138 copies/mL). A negative result must be combined with clinical observations, patient history, and epidemiological information. The expected result is Negative.  Fact Sheet for Patients:  BloggerCourse.com  Fact Sheet for Healthcare Providers:  SeriousBroker.it  This test is no t yet approved or cleared by the [place] FDA and  has been authorized for detection and/or diagnosis of SARS-CoV-2 by FDA under an Emergency Use Authorization (EUA). This EUA will remain  in effect (meaning this test can be used) for the duration of the COVID-19 declaration under Section 564(b)(1) of the Act, 21 U.S.C.section 360bbb-3(b)(1), unless the authorization is terminated  or revoked sooner.       Influenza A by PCR NEGATIVE NEGATIVE Final   Influenza B by PCR NEGATIVE NEGATIVE Final    Comment: (NOTE) The Xpert Xpress SARS-CoV-2/FLU/RSV plus assay is intended  as an aid in the diagnosis of influenza from Nasopharyngeal swab specimens and should not be used as a sole basis for treatment. Nasal washings and aspirates are unacceptable for Xpert Xpress SARS-CoV-2/FLU/RSV testing.  Fact Sheet for Patients: BloggerCourse.com  Fact Sheet for Healthcare Providers: SeriousBroker.it  This test is not yet approved or cleared by the [place] FDA and has been authorized for detection and/or diagnosis of SARS-CoV-2 by FDA under an Emergency Use Authorization (EUA). This EUA will remain in effect (meaning this test can be used) for the duration of the COVID-19 declaration under Section 564(b)(1) of the Act, 21 U.S.C. section 360bbb-3(b)(1), unless the authorization is terminated or revoked.  Performed at [facility], [address] [place], [place] [address]          Radiology Studies: No results found.      Scheduled Meds: . Chlorhexidine Gluconate Cloth  6 each Topical Daily  . heparin  5,000 Units Subcutaneous Q8H  . insulin aspart  0-9 Units Subcutaneous Q4H  . predniSONE  50 mg Oral Q breakfast  . sodium zirconium cyclosilicate  10 g Oral BID   Continuous Infusions: .  sodium bicarbonate (isotonic) infusion in sterile water       LOS: 4 days    Time spent: 35 minutes.     Alba Cory, MD [facility]   If 7PM-7AM, please contact night-coverage [email or web address]  10/19/2020, 5:51 PM

## 2020-10-20 ENCOUNTER — Inpatient Hospital Stay (HOSPITAL_COMMUNITY): Payer: Self-pay

## 2020-10-20 HISTORY — PX: IR FLUORO GUIDE CV LINE RIGHT: IMG2283

## 2020-10-20 HISTORY — PX: IR US GUIDE VASC ACCESS RIGHT: IMG2390

## 2020-10-20 LAB — GLUCOSE, CAPILLARY
Glucose-Capillary: 103 mg/dL — ABNORMAL HIGH (ref 70–99)
Glucose-Capillary: 104 mg/dL — ABNORMAL HIGH (ref 70–99)
Glucose-Capillary: 119 mg/dL — ABNORMAL HIGH (ref 70–99)
Glucose-Capillary: 133 mg/dL — ABNORMAL HIGH (ref 70–99)
Glucose-Capillary: 138 mg/dL — ABNORMAL HIGH (ref 70–99)
Glucose-Capillary: 86 mg/dL (ref 70–99)

## 2020-10-20 LAB — CBC WITH DIFFERENTIAL/PLATELET
Abs Immature Granulocytes: 0.17 10*3/uL — ABNORMAL HIGH (ref 0.00–0.07)
Basophils Absolute: 0 10*3/uL (ref 0.0–0.1)
Basophils Relative: 0 %
Eosinophils Absolute: 0 10*3/uL (ref 0.0–0.5)
Eosinophils Relative: 0 %
HCT: 28.5 % — ABNORMAL LOW (ref 39.0–52.0)
Hemoglobin: 10.5 g/dL — ABNORMAL LOW (ref 13.0–17.0)
Immature Granulocytes: 1 %
Lymphocytes Relative: 13 %
Lymphs Abs: 1.6 10*3/uL (ref 0.7–4.0)
MCH: 30 pg (ref 26.0–34.0)
MCHC: 36.8 g/dL — ABNORMAL HIGH (ref 30.0–36.0)
MCV: 81.4 fL (ref 80.0–100.0)
Monocytes Absolute: 1.2 10*3/uL — ABNORMAL HIGH (ref 0.1–1.0)
Monocytes Relative: 10 %
Neutro Abs: 9.2 10*3/uL — ABNORMAL HIGH (ref 1.7–7.7)
Neutrophils Relative %: 76 %
Platelets: 214 10*3/uL (ref 150–400)
RBC: 3.5 MIL/uL — ABNORMAL LOW (ref 4.22–5.81)
RDW: 13.2 % (ref 11.5–15.5)
WBC: 12.3 10*3/uL — ABNORMAL HIGH (ref 4.0–10.5)
nRBC: 0 % (ref 0.0–0.2)

## 2020-10-20 LAB — COMPREHENSIVE METABOLIC PANEL
ALT: 66 U/L — ABNORMAL HIGH (ref 0–44)
AST: 41 U/L (ref 15–41)
Albumin: 2.2 g/dL — ABNORMAL LOW (ref 3.5–5.0)
Alkaline Phosphatase: 39 U/L (ref 38–126)
Anion gap: 15 (ref 5–15)
BUN: 110 mg/dL — ABNORMAL HIGH (ref 6–20)
CO2: 18 mmol/L — ABNORMAL LOW (ref 22–32)
Calcium: 7.9 mg/dL — ABNORMAL LOW (ref 8.9–10.3)
Chloride: 101 mmol/L (ref 98–111)
Creatinine, Ser: 11.18 mg/dL — ABNORMAL HIGH (ref 0.61–1.24)
GFR, Estimated: 6 mL/min — ABNORMAL LOW (ref >60)
Glucose, Bld: 112 mg/dL — ABNORMAL HIGH (ref 70–99)
Potassium: 3.6 mmol/L (ref 3.5–5.1)
Sodium: 134 mmol/L — ABNORMAL LOW (ref 135–145)
Total Bilirubin: 0.6 mg/dL (ref 0.3–1.2)
Total Protein: 4.3 g/dL — ABNORMAL LOW (ref 6.5–8.1)

## 2020-10-20 LAB — CK: Total CK: 3330 U/L — ABNORMAL HIGH (ref 49–397)

## 2020-10-20 LAB — PHOSPHORUS: Phosphorus: 7.7 mg/dL — ABNORMAL HIGH (ref 2.5–4.6)

## 2020-10-20 LAB — MAGNESIUM: Magnesium: 2.4 mg/dL (ref 1.7–2.4)

## 2020-10-20 LAB — T3, FREE: T3, Free: 1.2 pg/mL — ABNORMAL LOW (ref 2.0–4.4)

## 2020-10-20 MED ORDER — VITAMIN B-12 100 MCG PO TABS
100.0000 ug | ORAL_TABLET | Freq: Every day | ORAL | Status: DC
  Administered 2020-10-20 – 2020-10-31 (×12): 100 ug via ORAL
  Filled 2020-10-20 (×12): qty 1

## 2020-10-20 MED ORDER — LIDOCAINE HCL 1 % IJ SOLN
INTRAMUSCULAR | Status: AC
  Filled 2020-10-20: qty 20

## 2020-10-20 MED ORDER — HEPARIN SODIUM (PORCINE) 1000 UNIT/ML IJ SOLN
INTRAMUSCULAR | Status: AC
  Filled 2020-10-20: qty 1

## 2020-10-20 MED ORDER — FLUTICASONE PROPIONATE 50 MCG/ACT NA SUSP
1.0000 | Freq: Every day | NASAL | Status: DC
  Administered 2020-10-20 – 2020-10-31 (×12): 1 via NASAL
  Filled 2020-10-20: qty 16

## 2020-10-20 MED ORDER — FUROSEMIDE 10 MG/ML IJ SOLN
60.0000 mg | Freq: Three times a day (TID) | INTRAMUSCULAR | Status: AC
  Administered 2020-10-20 (×2): 60 mg via INTRAVENOUS
  Filled 2020-10-20 (×2): qty 6

## 2020-10-20 MED ORDER — ONDANSETRON HCL 4 MG/2ML IJ SOLN
4.0000 mg | Freq: Four times a day (QID) | INTRAMUSCULAR | Status: DC | PRN
  Administered 2020-10-20: 4 mg via INTRAVENOUS
  Filled 2020-10-20: qty 2

## 2020-10-20 MED ORDER — LIDOCAINE HCL 1 % IJ SOLN
INTRAMUSCULAR | Status: DC | PRN
  Administered 2020-10-20: 10 mL

## 2020-10-20 NOTE — Progress Notes (Signed)
PROGRESS NOTE    William Quinn  GMW:[id number] DOB: Mar 17, 1986 DOA: 10/15/2020 PCP: Patient, No Pcp Per   Brief Narrative: 34 year old with past medical history significant for avascular necrosis, narcolepsy, type 2 diabetes who presented to the ED for evaluation of numbness of his right leg.  Patient reported his symptoms started the day prior to admission.  He report having right lower extremity pain numbness and tingling.  Patient numbness is in a stocking type pattern.  Patient was found to have acute kidney injury with elevated BUN and creatinine. He was found to have also rhabdomyolysis with CK more than 50,000.  Assessment & Plan:   Active Problems:   AKI (acute kidney injury) (HCC)   Lower extremity weakness   Hyponatremia   Narcolepsy   Avascular necrosis (HCC)   Type 2 diabetes mellitus without complication (HCC)   Rhabdomyolysis   Weakness of left lower extremity  1-AKI probably related to rhabdomyolysis: Metabolic acidosis  Nephrology is following. renal failure secondary to rhabdomyolysis. Nephrology has concern that covid vaccine might have cause rhabdo.  Plan to give IV lasix today.   Acute right lower extremity weakness numbness: CT lumbar spine no evidence of disc herniation. He was a started on Solu-Medrol.  This was discontinued. TSH 0.199, free T3 1.2 low   free T4 normal. Might be related to sick euthyroid. Needs follow up thyroid function test in 2 weeks.  B12 level low normal. Start supplement.   History of a vascular necrosis: Presumably he was treated with long-term steroid for presumed multiple sclerosis.  In 2015 or 16 it was decided that he is did not have multiple sclerosis and oral steroids were discontinued.  Diabetes  type II: Continue with sliding scale insulin Continue to hold Metformin.  Hyponatremia: holding IV fluids. Plan for IV lasix due to pleural effusion, dyspnea.   Questionable gout: Uric acid at 12: On prednisone daily  now  Hyperkalemia: Received Lokelma Resolved.   Dyspnea; pleural effusion, atelectasis vs PNA.  Afebrile.  Incentive spirometry/  Hold on antibiotics.   Transaminases; likely related to Rhabdomyolysis. Trending down.   Estimated body mass index is 27.12 kg/m as calculated from the following:   Height as of this encounter: 5\' 8"  (1.727 m).   Weight as of this encounter: 80.9 kg.   DVT prophylaxis: Heparin Code Status: Full code Family Communication: Care discussed with patient Disposition Plan:  Status is: Inpatient  Remains inpatient appropriate because:IV treatments appropriate due to intensity of illness or inability to take PO   Dispo: The patient is from: Home              Anticipated d/c is to: Home              Anticipated d/c date is: 3 days              Patient currently is not medically stable to d/c.        Consultants:   Nephrology  Procedures:   None  Antimicrobials:    Subjective: He feels congested, nose and chest Report SOB.   Objective: Vitals:   10/19/20 0901 10/19/20 1644 10/19/20 2027 10/20/20 0501  BP: 123/76 126/70 123/72 127/72  Pulse: (!) 50 (!) 50 (!) 51 (!) 47  Resp: 18 18 18 16   Temp: 97.7 F (36.5 C) 98.5 F (36.9 C) 98.9 F (37.2 C) 98 F (36.7 C)  TempSrc: Oral Oral Oral Oral  SpO2: 96% 96% 96% 95%  Weight:   80.9 kg  Height:        Intake/Output Summary (Last 24 hours) at 10/20/2020 1011 Last data filed at 10/20/2020 0800 Gross per 24 hour  Intake 2108.46 ml  Output 625 ml  Net 1483.46 ml   Filed Weights   10/15/20 1143 10/19/20 2027  Weight: 65.8 kg 80.9 kg    Examination:  General exam: NAD Respiratory system: BL crackles.  Cardiovascular system: S 1, S 2 RRR Gastrointestinal system: BS present, soft, nt Central nervous system: alert Extremities: trace edema    Data Reviewed: I have personally reviewed following labs and imaging studies  CBC: Recent Labs  Lab 10/16/20 0544 10/17/20 0518  10/18/20 0532 10/19/20 0222 10/20/20 0348  WBC 6.7 11.7* 11.6* 10.4 12.3*  NEUTROABS 5.9 9.7* 9.0* 8.4* 9.2*  HGB 11.5* 9.9* 10.0* 10.8* 10.5*  HCT 33.8* 29.1* 28.8* 29.7* 28.5*  MCV 85.6 85.6 85.5 82.0 81.4  PLT 166 147* 160 200 214   Basic Metabolic Panel: Recent Labs  Lab 10/16/20 0544 10/17/20 0518 10/18/20 0532 10/19/20 0222 10/20/20 0348  NA 125* 128* 131* 132* 134*  K 5.2* 5.2* 4.8 4.5 3.6  CL 93* 96* 102 103 101  CO2 19* 20* 16* 17* 18*  GLUCOSE 129* 112* 91 115* 112*  BUN 63* 87* 99* 109* 110*  CREATININE 5.61* 7.82* 9.16* 10.62* 11.18*  CALCIUM 7.8* 7.7* 7.5* 7.7* 7.9*  MG 2.5* 2.7* 2.5* 2.3 2.4  PHOS 6.9* 7.3* 7.6* 7.4* 7.7*   GFR: Estimated Creatinine Clearance: 8.8 mL/min (A) (by C-G formula based on SCr of 11.18 mg/dL (H)). Liver Function Tests: Recent Labs  Lab 10/16/20 0544 10/17/20 0518 10/18/20 0532 10/19/20 0222 10/20/20 0348  AST 505* 248* 107* 68* 41  ALT 209* 156* 112* 89* 66*  ALKPHOS 60 50 43 44 39  BILITOT 0.8 0.7 0.5 0.8 0.6  PROT 5.6* 5.4* 5.1* 4.7* 4.3*  ALBUMIN 3.0* 2.9* 2.6* 2.3* 2.2*   No results for input(s): LIPASE, AMYLASE in the last 168 hours. No results for input(s): AMMONIA in the last 168 hours. Coagulation Profile: No results for input(s): INR, PROTIME in the last 168 hours. Cardiac Enzymes: Recent Labs  Lab 10/15/20 2155 10/17/20 0518 10/18/20 0532 10/19/20 0222 10/20/20 0348  CKTOTAL 16,109* 23,778* 9,665* 5,481* 3,330*   BNP (last 3 results) No results for input(s): PROBNP in the last 8760 hours. HbA1C: No results for input(s): HGBA1C in the last 72 hours. CBG: Recent Labs  Lab 10/19/20 1643 10/19/20 2025 10/20/20 0016 10/20/20 0404 10/20/20 0807  GLUCAP 155* 145* 138* 104* 86   Lipid Profile: No results for input(s): CHOL, HDL, LDLCALC, TRIG, CHOLHDL, LDLDIRECT in the last 72 hours. Thyroid Function Tests: Recent Labs    10/19/20 1151  FREET4 0.61  T3FREE 1.2*   Anemia Panel: Recent Labs     10/19/20 1151  VITAMINB12 250   Sepsis Labs: No results for input(s): PROCALCITON, LATICACIDVEN in the last 168 hours.  Recent Results (from the past 240 hour(s))  Resp Panel by RT-PCR (Flu A&B, Covid) Nasopharyngeal Swab     Status: None   Collection Time: 10/15/20  1:57 PM   Specimen: Nasopharyngeal Swab; Nasopharyngeal(NP) swabs in vial transport medium  Result Value Ref Range Status   SARS Coronavirus 2 by RT PCR NEGATIVE NEGATIVE Final    Comment: (NOTE) SARS-CoV-2 target nucleic acids are NOT DETECTED.  The SARS-CoV-2 RNA is generally detectable in upper respiratory specimens during the acute phase of infection. The lowest concentration of SARS-CoV-2 viral copies this assay can detect  is 138 copies/mL. A negative result does not preclude SARS-Cov-2 infection and should not be used as the sole basis for treatment or other patient management decisions. A negative result may occur with  improper specimen collection/handling, submission of specimen other than nasopharyngeal swab, presence of viral mutation(s) within the areas targeted by this assay, and inadequate number of viral copies(<138 copies/mL). A negative result must be combined with clinical observations, patient history, and epidemiological information. The expected result is Negative.  Fact Sheet for Patients:  BloggerCourse.com  Fact Sheet for Healthcare Providers:  SeriousBroker.it  This test is no t yet approved or cleared by the [place] FDA and  has been authorized for detection and/or diagnosis of SARS-CoV-2 by FDA under an Emergency Use Authorization (EUA). This EUA will remain  in effect (meaning this test can be used) for the duration of the COVID-19 declaration under Section 564(b)(1) of the Act, 21 U.S.C.section 360bbb-3(b)(1), unless the authorization is terminated  or revoked sooner.       Influenza A by PCR NEGATIVE NEGATIVE Final    Influenza B by PCR NEGATIVE NEGATIVE Final    Comment: (NOTE) The Xpert Xpress SARS-CoV-2/FLU/RSV plus assay is intended as an aid in the diagnosis of influenza from Nasopharyngeal swab specimens and should not be used as a sole basis for treatment. Nasal washings and aspirates are unacceptable for Xpert Xpress SARS-CoV-2/FLU/RSV testing.  Fact Sheet for Patients: BloggerCourse.com  Fact Sheet for Healthcare Providers: SeriousBroker.it  This test is not yet approved or cleared by the [place] FDA and has been authorized for detection and/or diagnosis of SARS-CoV-2 by FDA under an Emergency Use Authorization (EUA). This EUA will remain in effect (meaning this test can be used) for the duration of the COVID-19 declaration under Section 564(b)(1) of the Act, 21 U.S.C. section 360bbb-3(b)(1), unless the authorization is terminated or revoked.  Performed at [facility], [address] [place], [place] [address]          Radiology Studies: No results found.      Scheduled Meds:  Chlorhexidine Gluconate Cloth  6 each Topical Daily   fluticasone  1 spray Each Nare Daily   heparin  5,000 Units Subcutaneous Q8H   insulin aspart  0-9 Units Subcutaneous Q4H   predniSONE  50 mg Oral Q breakfast   sodium zirconium cyclosilicate  10 g Oral BID   Continuous Infusions:   sodium bicarbonate (isotonic) infusion in sterile water 75 mL/hr at 10/20/20 0846     LOS: 5 days    Time spent: 35 minutes.     Alba Cory, MD [facility]   If 7PM-7AM, please contact night-coverage [email or web address]  10/20/2020, 10:11 AM

## 2020-10-20 NOTE — Procedures (Signed)
Interventional Radiology Procedure Note  Procedure:   Image guided temp HD catheter. 16cm tip to cuff .  Complications: None  Recommendations:  - OK to use - Do not submerge - May be converted if need be.  - Routine line care   Signed,  Yvone Neu. Loreta Ave, DO

## 2020-10-20 NOTE — TOC Initial Note (Addendum)
Transition of Care [facility]) - Initial/Assessment Note    Patient Details  Name: William Quinn MRN: [id number] Date of Birth: 1986-08-18  [facility] CM/SW Contact:    Bess Kinds, RN Phone Number: 10/20/2020, 10:31 AM  Clinical Narrative:           Acknowledging consult for uninsured, medication assistance, transportation, and homeless needs.          Spoke with patient at the bedside to discuss transition planning. Patient verified no insurance, and stated [employer] is too expensive. He had been working in home improvement. Email sent to financial counselor to screen for Medicaid potential.  He has been living in [place] - most recently [facility]. He stated that he can discharge to his dad's home which is address listed in Epic. Verified mobile number in Epic as correct.   May need assistance with transportation at discharge.  Only medication prior to admit was metformin which is on [facility] $4 list. Will follow for discharge medications and MATCH if needed.   He has a primary care doctor in [facility] who he will follow up with after discharge.  Denies any substance abuse or alcohol problems. Denies any SI or HI problems.   TOC following for transition needs.   Expected Discharge Plan: Home/Self Care Barriers to Discharge: Continued Medical Work up   Patient Goals and CMS Choice Patient states their goals for this hospitalization and ongoing recovery are:: discharge to dad's home in [email or web address] Compare Post Acute Care list provided to:: Patient Choice offered to / list presented to : NA  Expected Discharge Plan and Services Expected Discharge Plan: Home/Self Care In-house Referral: Clinical Social Work Discharge Planning Services: CM Consult Post Acute Care Choice: NA Living arrangements for the past 2 months: Apartment                 DME Arranged: N/A DME Agency: NA       HH Arranged: NA HH Agency: NA        Prior  Living Arrangements/Services Living arrangements for the past 2 months: Apartment Lives with:: Self Patient language and need for interpreter reviewed:: Yes        Need for Family Participation in Patient Care: No (Comment)     Criminal Activity/Legal Involvement Pertinent to Current Situation/Hospitalization: No - Comment as needed  Activities of Daily Living Home Assistive Devices/Equipment: None ADL Screening (condition at time of admission) Patient's cognitive ability adequate to safely complete daily activities?: Yes Is the patient deaf or have difficulty hearing?: No Does the patient have difficulty seeing, even when wearing glasses/contacts?: No Does the patient have difficulty concentrating, remembering, or making decisions?: No Patient able to express need for assistance with ADLs?: No Does the patient have difficulty dressing or bathing?: Yes Independently performs ADLs?: Yes (appropriate for developmental age) Does the patient have difficulty walking or climbing stairs?: Yes Weakness of Legs: Both (right greater than the left) Weakness of Arms/Hands: None  Permission Sought/Granted                  Emotional Assessment Appearance:: Appears stated age Attitude/Demeanor/Rapport: Engaged Affect (typically observed): Accepting Orientation: : Oriented to Self,Oriented to  Time,Oriented to Place,Oriented to Situation Alcohol / Substance Use: Not Applicable Psych Involvement: No (comment)  Admission diagnosis:  Paresthesia [R20.2] AKI (acute kidney injury) (HCC) [N17.9] Lower extremity weakness [R29.898] Weakness of left lower extremity [R29.898] Patient Active Problem List   Diagnosis Date Noted  .  Weakness of left lower extremity 10/16/2020  . AKI (acute kidney injury) (HCC) 10/15/2020  . Lower extremity weakness 10/15/2020  . Hyponatremia 10/15/2020  . Narcolepsy 10/15/2020  . Avascular necrosis (HCC) 10/15/2020  . Type 2 diabetes mellitus without  complication (HCC) 10/15/2020  . Rhabdomyolysis 10/15/2020   PCP:  Iven Finn., MD Pharmacy:   [facility] [phone] - [place], [place] - [address] [place] [address] Phone: [phone] Fax: [phone]  [facility] [place], [place] - [address] [place] [address] Phone: [phone] Fax: [phone]     Social Determinants of Health (SDOH) Interventions    Readmission Risk Interventions No flowsheet data found.

## 2020-10-20 NOTE — Progress Notes (Signed)
[facility] Progress Note  Subjective: feeling worse today, fatigued, congested, no jerking , mild nausea, no vomiting, some SOB.  CXR neg for congestion/ edema. UOP 350 cc yest, creat up 12  Vitals:   10/19/20 1644 10/19/20 2027 10/20/20 0501 10/20/20 1027  BP: 126/70 123/72 127/72 122/74  Pulse: (!) 50 (!) 51 (!) 47 (!) 49  Resp: 18 18 16 18   Temp: 98.5 F (36.9 C) 98.9 F (37.2 C) 98 F (36.7 C) 98.5 F (36.9 C)  TempSrc: Oral Oral Oral Oral  SpO2: 96% 96% 95% 95%  Weight:  80.9 kg    Height:        Exam:   alert, nad   no jvd  Chest cta bilat  Cor reg no RG  Abd soft ntnd no ascites   Ext no LE edema   Alert, NF, ox3  UA - prot 30, 0-5 rbc, large Hb, 6-10 wbc   Renal - IMPRESSION: Mild increase in echogenicity of the bilateral kidneys as can be seen in medical renal disease. No other acute finding.  Assessment/ Plan: 1. AKI - due to ATN oliguric due to acute rhabdomyolysis w/ CPK > 50,000. Renal US unremarkable. UA large Hb w/ small RBC's and wbc's. CReat 5.6 on admit > 7.8 > 9.1 > 10.6 > 11.1 today. Pt w/ new but mild uremic symptoms (lethargy, nausea).  Consulting IR for temp cath for HD in case needs HD in next 1-2 days.  SOB w/ neg  CXR , will dc IVF's and give lasix x 2 tonight.  2. Acute rhabdomyolysis - suspect this is complication of COVID vaccination.  There are case reports of acute rhabdo after COVID vaccination.  Improving.  3. Hyperkalemia - resolved 4. Transaminitis - rhabdo will cause ^^ AST / ALT that looks like liver damage but is really due to muscle damage     Rob Murdis Flitton 10/20/2020, 12:19 PM   Recent Labs  Lab 10/19/20 0222 10/20/20 0348  K 4.5 3.6  BUN 109* 110*  CREATININE 10.62* 11.18*  CALCIUM 7.7* 7.9*  PHOS 7.4* 7.7*  HGB 10.8* 10.5*   Inpatient medications: . Chlorhexidine Gluconate Cloth  6 each Topical Daily  . fluticasone  1 spray Each Nare Daily  . furosemide  60 mg Intravenous Q8H  . heparin  5,000 Units  Subcutaneous Q8H  . insulin aspart  0-9 Units Subcutaneous Q4H  . predniSONE  50 mg Oral Q breakfast    acetaminophen, fentaNYL (SUBLIMAZE) injection, influenza vac split quadrivalent PF, ondansetron (ZOFRAN) IV, pneumococcal 23 valent vaccine, traMADol

## 2020-10-20 NOTE — Plan of Care (Signed)
  Problem: Health Behavior/Discharge Planning: Goal: Ability to manage health-related needs will improve Outcome: Progressing   

## 2020-10-21 ENCOUNTER — Inpatient Hospital Stay (HOSPITAL_COMMUNITY): Payer: Self-pay

## 2020-10-21 DIAGNOSIS — R609 Edema, unspecified: Secondary | ICD-10-CM

## 2020-10-21 LAB — COMPREHENSIVE METABOLIC PANEL
ALT: 63 U/L — ABNORMAL HIGH (ref 0–44)
AST: 38 U/L (ref 15–41)
Albumin: 2.2 g/dL — ABNORMAL LOW (ref 3.5–5.0)
Alkaline Phosphatase: 37 U/L — ABNORMAL LOW (ref 38–126)
Anion gap: 13 (ref 5–15)
BUN: 119 mg/dL — ABNORMAL HIGH (ref 6–20)
CO2: 23 mmol/L (ref 22–32)
Calcium: 7.9 mg/dL — ABNORMAL LOW (ref 8.9–10.3)
Chloride: 98 mmol/L (ref 98–111)
Creatinine, Ser: 11.34 mg/dL — ABNORMAL HIGH (ref 0.61–1.24)
GFR, Estimated: 5 mL/min — ABNORMAL LOW (ref >60)
Glucose, Bld: 168 mg/dL — ABNORMAL HIGH (ref 70–99)
Potassium: 3.7 mmol/L (ref 3.5–5.1)
Sodium: 134 mmol/L — ABNORMAL LOW (ref 135–145)
Total Bilirubin: 0.7 mg/dL (ref 0.3–1.2)
Total Protein: 4.3 g/dL — ABNORMAL LOW (ref 6.5–8.1)

## 2020-10-21 LAB — CBC WITH DIFFERENTIAL/PLATELET
Abs Immature Granulocytes: 0.35 10*3/uL — ABNORMAL HIGH (ref 0.00–0.07)
Basophils Absolute: 0 10*3/uL (ref 0.0–0.1)
Basophils Relative: 0 %
Eosinophils Absolute: 0 10*3/uL (ref 0.0–0.5)
Eosinophils Relative: 0 %
HCT: 30 % — ABNORMAL LOW (ref 39.0–52.0)
Hemoglobin: 10.7 g/dL — ABNORMAL LOW (ref 13.0–17.0)
Immature Granulocytes: 3 %
Lymphocytes Relative: 10 %
Lymphs Abs: 1.3 10*3/uL (ref 0.7–4.0)
MCH: 29 pg (ref 26.0–34.0)
MCHC: 35.7 g/dL (ref 30.0–36.0)
MCV: 81.3 fL (ref 80.0–100.0)
Monocytes Absolute: 1.1 10*3/uL — ABNORMAL HIGH (ref 0.1–1.0)
Monocytes Relative: 9 %
Neutro Abs: 10.5 10*3/uL — ABNORMAL HIGH (ref 1.7–7.7)
Neutrophils Relative %: 78 %
Platelets: 232 10*3/uL (ref 150–400)
RBC: 3.69 MIL/uL — ABNORMAL LOW (ref 4.22–5.81)
RDW: 13.2 % (ref 11.5–15.5)
WBC: 13.3 10*3/uL — ABNORMAL HIGH (ref 4.0–10.5)
nRBC: 0 % (ref 0.0–0.2)

## 2020-10-21 LAB — GLUCOSE, CAPILLARY
Glucose-Capillary: 101 mg/dL — ABNORMAL HIGH (ref 70–99)
Glucose-Capillary: 136 mg/dL — ABNORMAL HIGH (ref 70–99)
Glucose-Capillary: 138 mg/dL — ABNORMAL HIGH (ref 70–99)
Glucose-Capillary: 142 mg/dL — ABNORMAL HIGH (ref 70–99)
Glucose-Capillary: 143 mg/dL — ABNORMAL HIGH (ref 70–99)
Glucose-Capillary: 191 mg/dL — ABNORMAL HIGH (ref 70–99)

## 2020-10-21 LAB — MAGNESIUM: Magnesium: 2.3 mg/dL (ref 1.7–2.4)

## 2020-10-21 LAB — PHOSPHORUS: Phosphorus: 7.7 mg/dL — ABNORMAL HIGH (ref 2.5–4.6)

## 2020-10-21 LAB — CK: Total CK: 2647 U/L — ABNORMAL HIGH (ref 49–397)

## 2020-10-21 LAB — TSH: TSH: 0.948 u[IU]/mL (ref 0.350–4.500)

## 2020-10-21 MED ORDER — PREDNISONE 20 MG PO TABS: 40.0000 mg | ORAL_TABLET | Freq: Every day | ORAL | Status: DC

## 2020-10-21 MED ORDER — ONDANSETRON HCL 4 MG/2ML IJ SOLN
4.0000 mg | Freq: Four times a day (QID) | INTRAMUSCULAR | Status: DC | PRN
  Administered 2020-10-21: 4 mg via INTRAVENOUS
  Administered 2020-10-22 – 2020-10-23 (×2): 8 mg via INTRAVENOUS
  Administered 2020-10-25: 4 mg via INTRAVENOUS
  Filled 2020-10-21: qty 4
  Filled 2020-10-21 (×2): qty 2
  Filled 2020-10-21: qty 4

## 2020-10-21 MED ORDER — SODIUM CHLORIDE 0.9 % IV SOLN
2.0000 g | INTRAVENOUS | Status: AC
  Administered 2020-10-21 – 2020-10-25 (×5): 2 g via INTRAVENOUS
  Filled 2020-10-21 (×5): qty 20

## 2020-10-21 MED ORDER — PREDNISONE 20 MG PO TABS
40.0000 mg | ORAL_TABLET | Freq: Every day | ORAL | Status: DC
  Administered 2020-10-22: 40 mg via ORAL
  Filled 2020-10-21: qty 2

## 2020-10-21 MED ORDER — DOXYCYCLINE HYCLATE 100 MG PO TABS
100.0000 mg | ORAL_TABLET | Freq: Two times a day (BID) | ORAL | Status: AC
  Administered 2020-10-21 – 2020-10-25 (×10): 100 mg via ORAL
  Filled 2020-10-21 (×10): qty 1

## 2020-10-21 MED ORDER — FUROSEMIDE 10 MG/ML IJ SOLN
60.0000 mg | Freq: Once | INTRAMUSCULAR | Status: AC
  Administered 2020-10-21: 60 mg via INTRAVENOUS
  Filled 2020-10-21: qty 6

## 2020-10-21 NOTE — Plan of Care (Signed)
  Problem: Education: Goal: Knowledge of General Education information will improve Description: Including pain rating scale, medication(s)/side effects and non-pharmacologic comfort measures Outcome: Progressing   Problem: Health Behavior/Discharge Planning: Goal: Ability to manage health-related needs will improve Outcome: Progressing   Problem: Clinical Measurements: Goal: Ability to maintain clinical measurements within normal limits will improve Outcome: Progressing Goal: Will remain free from infection Outcome: Progressing Goal: Diagnostic test results will improve Outcome: Progressing Goal: Respiratory complications will improve Outcome: Progressing Goal: Cardiovascular complication will be avoided Outcome: Progressing   Problem: Activity: Goal: Risk for activity intolerance will decrease Outcome: Progressing   Problem: Nutrition: Goal: Adequate nutrition will be maintained Outcome: Progressing   Problem: Coping: Goal: Level of anxiety will decrease Outcome: Progressing   Problem: Elimination: Goal: Will not experience complications related to bowel motility Outcome: Progressing Goal: Will not experience complications related to urinary retention Outcome: Progressing   Problem: Pain Managment: Goal: General experience of comfort will improve Outcome: Progressing   Problem: Safety: Goal: Ability to remain free from injury will improve Outcome: Progressing   Problem: Health Behavior/Discharge Planning: Goal: Ability to manage health-related needs will improve Outcome: Progressing   Problem: Activity: Goal: Activity intolerance will improve Outcome: Progressing   Problem: Fluid Volume: Goal: Fluid volume balance will be maintained or improved Outcome: Progressing   Problem: Nutritional: Goal: Ability to make appropriate dietary choices will improve Outcome: Progressing   Problem: Self-Concept: Goal: Body image disturbance will be avoided or  minimized Outcome: Progressing   Problem: Urinary Elimination: Goal: Progression of disease will be identified and treated Outcome: Progressing

## 2020-10-21 NOTE — Progress Notes (Signed)
PROGRESS NOTE    William Quinn  ZOX:[id number] DOB: Feb 29, 1986 DOA: 10/15/2020 PCP: Iven Finn., MD   Brief Narrative: 34 year old with past medical history significant for avascular necrosis, narcolepsy, type 2 diabetes who presented to the ED for evaluation of numbness of his right leg.  Patient reported his symptoms started the day prior to admission.  He report having right lower extremity pain numbness and tingling.  Patient numbness is in a stocking type pattern.  Patient was found to have acute kidney injury with elevated BUN and creatinine. He was found to have also rhabdomyolysis with CK more than 50,000.  Assessment & Plan:   Active Problems:   AKI (acute kidney injury) (HCC)   Lower extremity weakness   Hyponatremia   Narcolepsy   Avascular necrosis (HCC)   Type 2 diabetes mellitus without complication (HCC)   Rhabdomyolysis   Weakness of left lower extremity  1-AKI probably related to Rhabdomyolysis: Metabolic acidosis  Nephrology is following. Renal failure secondary to rhabdomyolysis. Nephrology has concern that covid vaccine might have cause rhabdo.  Had HD catheter placement on 12/16. No clear source for Rhabdo, Medication related ???  Acute right lower extremity weakness numbness: CT lumbar spine no evidence of disc herniation. He was a started on Solu-Medrol.  This was discontinued. TSH 0.199, free T3 1.2 low   free T4 normal. Might be related to sick euthyroid. Needs follow up thyroid function test in 2 weeks.  B12 level low normal. Started  supplement.  Taper prednisone.   History of a vascular necrosis: Presumably he was treated with long-term steroid for presumed multiple sclerosis.  In 2015 or 16 it was decided that he is did not have multiple sclerosis and oral steroids were discontinued.  Diabetes  type II: Continue with sliding scale insulin Continue to hold Metformin.  Hyponatremia: holding IV fluids. Plan for IV lasix due to pleural  effusion, dyspnea.   Questionable gout: Uric acid at 12: On prednisone daily now, taper prednisone.   Hyperkalemia: Received Lokelma Resolved.   Dyspnea; pleural effusion, atelectasis vs PNA.  Afebrile.  Incentive spirometry/  Will treated for PNA for 5 days.   Transaminases; likely related to Rhabdomyolysis. Trending down.   Estimated body mass index is 27.32 kg/m as calculated from the following:   Height as of this encounter:  (1.727 m).   Weight as of this encounter: 81.5 kg.   DVT prophylaxis: Heparin Code Status: Full code Family Communication: Care discussed with patient Disposition Plan:  Status is: Inpatient  Remains inpatient appropriate because:IV treatments appropriate due to intensity of illness or inability to take PO   Dispo: The patient is from: Home              Anticipated d/c is to: Home              Anticipated d/c date is: 3 days              Patient currently is not medically stable to d/c.        Consultants:   Nephrology  Procedures:   None  Antimicrobials:    Subjective: He report cough. Congestion has improved.  Leg pain and weakness improved  Objective: Vitals:   10/20/20 1652 10/20/20 2045 10/21/20 0613 10/21/20 0922  BP: 123/75 123/72 120/71 127/77  Pulse: (!) 50 (!) 50 (!) 47 (!) 54  Resp: Temp: 99 F (37.2 C) 98.1 F (36.7 C) 98 F (36.7 C) 98.4 F (  36.9 C)  TempSrc: Oral Oral Oral   SpO2: 95% 94% 94% 94%  Weight:  81.5 kg    Height:        Intake/Output Summary (Last 24 hours) at 10/21/2020 1001 Last data filed at 10/21/2020 0600 Gross per 24 hour  Intake 600 ml  Output 3000 ml  Net -2400 ml   Filed Weights   10/15/20 1143 10/19/20 2027 10/20/20 2045  Weight: 65.8 kg 80.9 kg 81.5 kg    Examination:  General exam: NAD Respiratory system: BL crackles.  Cardiovascular system: S 1, S 2 RRR Gastrointestinal system: BS present, soft, nt Central nervous system: alert Extremities: no  edema    Data Reviewed: I have personally reviewed following labs and imaging studies  CBC: Recent Labs  Lab 10/17/20 0518 10/18/20 0532 10/19/20 0222 10/20/20 0348 10/21/20 0150  WBC 11.7* 11.6* 10.4 12.3* 13.3*  NEUTROABS 9.7* 9.0* 8.4* 9.2* 10.5*  HGB 9.9* 10.0* 10.8* 10.5* 10.7*  HCT 29.1* 28.8* 29.7* 28.5* 30.0*  MCV 85.6 85.5 82.0 81.4 81.3  PLT 147* 160 200 214 232   Basic Metabolic Panel: Recent Labs  Lab 10/17/20 0518 10/18/20 0532 10/19/20 0222 10/20/20 0348 10/21/20 0150  NA 128* 131* 132* 134* 134*  K 5.2* 4.8 4.5 3.6 3.7  CL 96* 102 103 101 98  CO2 20* 16* 17* 18* 23  GLUCOSE 112* 91 115* 112* 168*  BUN 87* 99* 109* 110* 119*  CREATININE 7.82* 9.16* 10.62* 11.18* 11.34*  CALCIUM 7.7* 7.5* 7.7* 7.9* 7.9*  MG 2.7* 2.5* 2.3 2.4 2.3  PHOS 7.3* 7.6* 7.4* 7.7* 7.7*   GFR: Estimated Creatinine Clearance: 8.7 mL/min (A) (by C-G formula based on SCr of 11.34 mg/dL (H)). Liver Function Tests: Recent Labs  Lab 10/17/20 0518 10/18/20 0532 10/19/20 0222 10/20/20 0348 10/21/20 0150  AST 248* 107* 68* 41 38  ALT 156* 112* 89* 66* 63*  ALKPHOS 50 43 44 39 37*  BILITOT 0.7 0.5 0.8 0.6 0.7  PROT 5.4* 5.1* 4.7* 4.3* 4.3*  ALBUMIN 2.9* 2.6* 2.3* 2.2* 2.2*   No results for input(s): LIPASE, AMYLASE in the last 168 hours. No results for input(s): AMMONIA in the last 168 hours. Coagulation Profile: No results for input(s): INR, PROTIME in the last 168 hours. Cardiac Enzymes: Recent Labs  Lab 10/17/20 0518 10/18/20 0532 10/19/20 0222 10/20/20 0348 10/21/20 0150  CKTOTAL 23,778* 9,665* 5,481* 3,330* 2,647*   BNP (last 3 results) No results for input(s): PROBNP in the last 8760 hours. HbA1C: No results for input(s): HGBA1C in the last 72 hours. CBG: Recent Labs  Lab 10/20/20 1655 10/20/20 2003 10/21/20 0005 10/21/20 0422 10/21/20 0800  GLUCAP 119* 133* 191* 142* 138*   Lipid Profile: No results for input(s): CHOL, HDL, LDLCALC, TRIG, CHOLHDL,  LDLDIRECT in the last 72 hours. Thyroid Function Tests: Recent Labs    10/19/20 1151  FREET4 0.61  T3FREE 1.2*   Anemia Panel: Recent Labs    10/19/20 1151  VITAMINB12 250   Sepsis Labs: No results for input(s): PROCALCITON, LATICACIDVEN in the last 168 hours.  Recent Results (from the past 240 hour(s))  Resp Panel by RT-PCR (Flu A&B, Covid) Nasopharyngeal Swab     Status: None   Collection Time: 10/15/20  1:57 PM   Specimen: Nasopharyngeal Swab; Nasopharyngeal(NP) swabs in vial transport medium  Result Value Ref Range Status   SARS Coronavirus 2 by RT PCR NEGATIVE NEGATIVE Final    Comment: (NOTE) SARS-CoV-2 target nucleic acids are NOT DETECTED.  The  SARS-CoV-2 RNA is generally detectable in upper respiratory specimens during the acute phase of infection. The lowest concentration of SARS-CoV-2 viral copies this assay can detect is 138 copies/mL. A negative result does not preclude SARS-Cov-2 infection and should not be used as the sole basis for treatment or other patient management decisions. A negative result may occur with  improper specimen collection/handling, submission of specimen other than nasopharyngeal swab, presence of viral mutation(s) within the areas targeted by this assay, and inadequate number of viral copies(<138 copies/mL). A negative result must be combined with clinical observations, patient history, and epidemiological information. The expected result is Negative.  Fact Sheet for Patients:  BloggerCourse.com  Fact Sheet for Healthcare Providers:  SeriousBroker.it  This test is no t yet approved or cleared by the [place] FDA and  has been authorized for detection and/or diagnosis of SARS-CoV-2 by FDA under an Emergency Use Authorization (EUA). This EUA will remain  in effect (meaning this test can be used) for the duration of the COVID-19 declaration under Section 564(b)(1) of the Act,  21 U.S.C.section 360bbb-3(b)(1), unless the authorization is terminated  or revoked sooner.       Influenza A by PCR NEGATIVE NEGATIVE Final   Influenza B by PCR NEGATIVE NEGATIVE Final    Comment: (NOTE) The Xpert Xpress SARS-CoV-2/FLU/RSV plus assay is intended as an aid in the diagnosis of influenza from Nasopharyngeal swab specimens and should not be used as a sole basis for treatment. Nasal washings and aspirates are unacceptable for Xpert Xpress SARS-CoV-2/FLU/RSV testing.  Fact Sheet for Patients: BloggerCourse.com  Fact Sheet for Healthcare Providers: SeriousBroker.it  This test is not yet approved or cleared by the [place] FDA and has been authorized for detection and/or diagnosis of SARS-CoV-2 by FDA under an Emergency Use Authorization (EUA). This EUA will remain in effect (meaning this test can be used) for the duration of the COVID-19 declaration under Section 564(b)(1) of the Act, 21 U.S.C. section 360bbb-3(b)(1), unless the authorization is terminated or revoked.  Performed at [facility], [address] [place], [place] [address]          Radiology Studies: DG Chest 2 View  Result Date: 10/20/2020 CLINICAL DATA:  Numbness right leg. EXAM: CHEST - 2 VIEW COMPARISON:  10/16/2020 FINDINGS: Heart size upper normal.  Vascularity normal. Left lower lobe airspace density has developed since the prior study possibly atelectasis. Small bilateral pleural effusions. Negative for pulmonary edema. IMPRESSION: Small bilateral pleural effusions.  Negative for heart failure. Left lower lobe airspace disease likely atelectasis or pneumonia. Electronically Signed   By: Marlan Palau M.D.   On: 10/20/2020 11:14   IR Fluoro Guide CV Line Right  Result Date: 10/20/2020 INDICATION: 34 year old male referred for temporary hemodialysis catheter placement for treatment of rhabdomyolysis EXAM: IMAGE GUIDED  TEMPORARY HEMODIALYSIS CATHETER PLACEMENT MEDICATIONS: None ANESTHESIA/SEDATION: None FLUOROSCOPY TIME:  Fluoroscopy Time: 0 minutes 6 seconds (1 mGy). COMPLICATIONS: None PROCEDURE: Informed written consent was obtained from the patient and the patient's family after a thorough discussion of the procedural risks, benefits and alternatives. All questions were addressed. A timeout was performed prior to the initiation of the procedure. The right neck and chest was prepped with chlorhexidine, and draped in the usual sterile fashion using maximum barrier technique (cap and mask, sterile gown, sterile gloves, large sterile sheet, hand hygiene and cutaneous antiseptic). Local anesthesia was attained by infiltration with 1% lidocaine without epinephrine. Ultrasound demonstrated patency of the right internal jugular vein, and  this was documented with an image. Under real-time ultrasound guidance, this vein was accessed with a 21 gauge micropuncture needle and image documentation was performed. A small dermatotomy was made at the access site with an 11 scalpel. A 0.018" wire was advanced into the SVC and the access needle exchanged for a 39F micropuncture vascular sheath. The 0.018" wire was then removed and a 0.035" wire advanced into the IVC. We elected to place a 16 cm temp catheter. Skin and subcutaneous tissues were serially dilated. Catheter was placed on the wire. The catheter tip is positioned in the upper right atrium. This was documented with a spot image. Both ports of the hemodialysis catheter were then tested for excellent function. The ports were then locked with heparinized lock. Patient tolerated the procedure well and remained hemodynamically stable throughout. No complications were encountered and no significant blood loss was encountered. IMPRESSION: Status post image guided placement of 16 cm non tunneled hemodialysis catheter. Signed, Yvone NeuJaime S. Reyne DumasWagner, DO, RPVI [facility] Electronically Signed   By: Gilmer MorJaime  Wagner D.O.   On: 10/20/2020 16:39   IR US Guide Vasc Access Right  Result Date: 10/20/2020 INDICATION: 34 year old male referred for temporary hemodialysis catheter placement for treatment of rhabdomyolysis EXAM: IMAGE GUIDED TEMPORARY HEMODIALYSIS CATHETER PLACEMENT MEDICATIONS: None ANESTHESIA/SEDATION: None FLUOROSCOPY TIME:  Fluoroscopy Time: 0 minutes 6 seconds (1 mGy). COMPLICATIONS: None PROCEDURE: Informed written consent was obtained from the patient and the patient's family after a thorough discussion of the procedural risks, benefits and alternatives. All questions were addressed. A timeout was performed prior to the initiation of the procedure. The right neck and chest was prepped with chlorhexidine, and draped in the usual sterile fashion using maximum barrier technique (cap and mask, sterile gown, sterile gloves, large sterile sheet, hand hygiene and cutaneous antiseptic). Local anesthesia was attained by infiltration with 1% lidocaine without epinephrine. Ultrasound demonstrated patency of the right internal jugular vein, and this was documented with an image. Under real-time ultrasound guidance, this vein was accessed with a 21 gauge micropuncture needle and image documentation was performed. A small dermatotomy was made at the access site with an 11 scalpel. A 0.018" wire was advanced into the SVC and the access needle exchanged for a 39F micropuncture vascular sheath. The 0.018" wire was then removed and a 0.035" wire advanced into the IVC. We elected to place a 16 cm temp catheter. Skin and subcutaneous tissues were serially dilated. Catheter was placed on the wire. The catheter tip is positioned in the upper right atrium. This was documented with a spot image. Both ports of the hemodialysis catheter were then tested for excellent function. The ports were then locked with heparinized lock. Patient tolerated the procedure well  and remained hemodynamically stable throughout. No complications were encountered and no significant blood loss was encountered. IMPRESSION: Status post image guided placement of 16 cm non tunneled hemodialysis catheter. Signed, Yvone NeuJaime S. Reyne DumasWagner, DO, RPVI [facility] Electronically Signed   By: Gilmer MorJaime  Wagner D.O.   On: 10/20/2020 16:39        Scheduled Meds:  Chlorhexidine Gluconate Cloth  6 each Topical Daily   doxycycline  100 mg Oral Q12H   fluticasone  1 spray Each Nare Daily   heparin  5,000 Units Subcutaneous Q8H   insulin aspart  0-9 Units Subcutaneous Q4H   predniSONE  50 mg Oral Q breakfast   vitamin B-12  100 mcg Oral Daily  Continuous Infusions:  cefTRIAXone (ROCEPHIN)  IV       LOS: 6 days    Time spent: 35 minutes.     Alba Cory, MD [facility]   If 7PM-7AM, please contact night-coverage [email or web address]  10/21/2020, 10:01 AM

## 2020-10-21 NOTE — Progress Notes (Signed)
PT Cancellation Note  Patient Details Name: William Quinn MRN: [id number] DOB: [birth year]   Cancelled Treatment:    Reason Eval/Treat Not Completed: Other (comment).  Pt was having a procedure when PT initially attempted to see him, then was waiting for pain meds.  Has reported fatigue as he just walked down the hallway.  Retry as time and pt allow.   Ivar Drape 10/21/2020, 3:04 PM   Samul Dada, PT MS Acute Rehab Dept. Number: [facility] [id number] and [facility] [phone]

## 2020-10-21 NOTE — Progress Notes (Signed)
[facility] Progress Note  Subjective: feeling better today, 1.4 L out w/ IV lasix yest, not as tired, nausea improving. No SOB.   Vitals:   10/20/20 1652 10/20/20 2045 10/21/20 0613 10/21/20 0922  BP: 123/75 123/72 120/71 127/77  Pulse: (!) 50 (!) 50 (!) 47 (!) 54  Resp: 16 18 16 18   Temp: 99 F (37.2 C) 98.1 F (36.7 C) 98 F (36.7 C) 98.4 F (36.9 C)  TempSrc: Oral Oral Oral   SpO2: 95% 94% 94% 94%  Weight:  81.5 kg    Height:        Exam:   alert, nad   no jvd  Chest cta bilat  Cor reg no RG  Abd soft ntnd no ascites   Ext no LE edema   Alert, NF, ox3  UA - prot 30, 0-5 rbc, large Hb, 6-10 wbc   Renal - IMPRESSION: Mild increase in echogenicity of the bilateral kidneys as can be seen in medical renal disease. No other acute finding.  Assessment/ Plan: 1. AKI - due to ATN oliguric due to acute rhabdomyolysis w/ CPK > 50,000. Renal US unremarkable. UA large Hb w/ small RBC's and wbc's. CReat 5.6 on admit > 7.8 > 9.1 > 10.6 > 11.1 yest and 11.3 today. Feeling a little better. Good UOP, got IV lasix yet due to vol ^^. Will give another IV lasix x 1 today. Suspect renal function is recovering. Will hold off on HD for now. F/u in am.  2. Acute rhabdomyolysis - suspect this is complication of COVID vaccination.  There are case reports of acute rhabdo after COVID vaccination.  Improving.  3. Hyperkalemia - resolved 4. Transaminitis - rhabdo will cause ^^ AST / ALT that looks like liver damage but is really due to muscle damage     Rob Valentine Kuechle 10/21/2020, 12:37 PM   Recent Labs  Lab 10/20/20 0348 10/21/20 0150  K 3.6 3.7  BUN 110* 119*  CREATININE 11.18* 11.34*  CALCIUM 7.9* 7.9*  PHOS 7.7* 7.7*  HGB 10.5* 10.7*   Inpatient medications: . Chlorhexidine Gluconate Cloth  6 each Topical Daily  . doxycycline  100 mg Oral Q12H  . fluticasone  1 spray Each Nare Daily  . heparin  5,000 Units Subcutaneous Q8H  . insulin aspart  0-9 Units Subcutaneous  Q4H  . predniSONE  50 mg Oral Q breakfast  . vitamin B-12  100 mcg Oral Daily   . cefTRIAXone (ROCEPHIN)  IV     acetaminophen, fentaNYL (SUBLIMAZE) injection, influenza vac split quadrivalent PF, lidocaine, ondansetron (ZOFRAN) IV, pneumococcal 23 valent vaccine, traMADol

## 2020-10-21 NOTE — Progress Notes (Signed)
Bilateral lower extremity venous duplex has been completed. Preliminary results can be found in CV Proc through chart review.   10/21/20 3:56 PM Olen Cordial RVT

## 2020-10-21 NOTE — Evaluation (Signed)
Occupational Therapy Evaluation Patient Details Name: William Quinn MRN: [id number] DOB: 09-Feb-1986 Today's Date: 10/21/2020    History of Present Illness 34 yo male presenting to ED with numbness and pain of RLE. found to have acute kidney injury with elevated BUN and creatinine. PMH including avascular necrosis, narcolepsy, and type 2 diabetes.   Clinical Impression   PTA, pt was living with his roommate and was independent; reports he is unsure of dc plan. Pt currently performing ADLs and functional mobility at Supervision level with use of RW. Pt presenting with decreased activity tolerance due to fatigue and pain. Pt performing functional mobility in hallway for ~130 x2 requiring seated rest break. Pt would benefit from further acute OT to facilitate safe dc. Recommend dc home once medically stable per physician.     Follow Up Recommendations  No OT follow up;Supervision - Intermittent    Equipment Recommendations  3 in 1 bedside commode (To use as shower seat)    Recommendations for Other Services PT consult     Precautions / Restrictions        Mobility Bed Mobility Overal bed mobility: Modified Independent             General bed mobility comments: Increased time    Transfers Overall transfer level: Needs assistance Equipment used: Rolling walker (2 wheeled) Transfers: Sit to/from Stand Sit to Stand: Supervision         General transfer comment: Supervision for safety. Pt with tendency to grab RW handles and pull into standing. Cues for safe hand placement nad RW management.    Balance Overall balance assessment: Needs assistance Sitting-balance support: No upper extremity supported;Feet supported Sitting balance-Leahy Scale: Fair     Standing balance support: Single extremity supported;During functional activity;Bilateral upper extremity supported Standing balance-Leahy Scale: Poor Standing balance comment: Pt maintaining atleast one UE at RW. But  will take a single hand off RW to adjust something without LOB. Feel pt is close to fair stanidng balance                           ADL either performed or assessed with clinical judgement   ADL Overall ADL's : Needs assistance/impaired                                       General ADL Comments: Pt performing ADLs and functional mobility at SUpervision level. Pt requiring increased time for processing and to manage pain at RLE. Providing education on safe transfer techniques to decrease pain. Pt walking ~130 feet in hallway taking rest break and then walking back to room. 260 feet total     Vision         Perception     Praxis      Pertinent Vitals/Pain Pain Assessment: Faces Faces Pain Scale: Hurts little more Pain Location: RLE Pain Descriptors / Indicators: Discomfort;Grimacing;Numbness Pain Intervention(s): Monitored during session;Limited activity within patient's tolerance;Repositioned     Hand Dominance     Extremity/Trunk Assessment Upper Extremity Assessment Upper Extremity Assessment: Overall WFL for tasks assessed   Lower Extremity Assessment Lower Extremity Assessment: Defer to PT evaluation   Cervical / Trunk Assessment Cervical / Trunk Assessment: Normal   Communication Communication Communication: No difficulties   Cognition Arousal/Alertness: Awake/alert Behavior During Therapy: WFL for tasks assessed/performed Overall Cognitive Status: Within Functional Limits for tasks assessed  General Comments: Pt with increased time for answering questions and and can be vague with answers. Feel pt is lose to baseline cognition   General Comments  HR stable.    Exercises     Shoulder Instructions      Home Living Family/patient expects to be discharged to:: Unsure Living Arrangements: Non-relatives/Friends (Rommate "William Quinn") Available Help at Discharge: Friend(s);Available  PRN/intermittently Type of Home: House Home Access: Stairs to enter [employer] of Steps: "a couple"   Home Layout: One level     Bathroom Shower/Tub: Producer, television/film/video: Standard     Home Equipment: None   Additional Comments: Lives with roommate William Quinn in a one story home. Pt vague about information and requiring several follow up questions.      Prior Functioning/Environment Level of Independence: Independent        Comments: Was independent with BADLs, IADLs, and working as a Surveyor, minerals (?). Pt reporting he was independent and working but became vague with information on job type        OT Problem List: Decreased strength;Decreased range of motion;Decreased activity tolerance;Impaired balance (sitting and/or standing);Decreased knowledge of use of DME or AE;Decreased knowledge of precautions;Decreased safety awareness;Pain      OT Treatment/Interventions: Self-care/ADL training;Therapeutic exercise;DME and/or AE instruction;Energy conservation;Therapeutic activities;Patient/family education    OT Goals(Current goals can be found in the care plan section) Acute Rehab OT Goals Patient Stated Goal: Decrease R leg pain OT Goal Formulation: With patient Time For Goal Achievement: 11/04/20 Potential to Achieve Goals: Good  OT Frequency: Min 2X/week   Barriers to D/C:            Co-evaluation              AM-PAC OT "6 Clicks" Daily Activity     Outcome Measure Help from another person eating meals?: None Help from another person taking care of personal grooming?: A Little Help from another person toileting, which includes using toliet, bedpan, or urinal?: A Little Help from another person bathing (including washing, rinsing, drying)?: A Little Help from another person to put on and taking off regular upper body clothing?: A Little Help from another person to put on and taking off regular lower body clothing?: A Little 6 Click Score: 19    End of Session Equipment Utilized During Treatment: Rolling walker Nurse Communication: Mobility status  Activity Tolerance: Patient tolerated treatment well Patient left: in chair;with call bell/phone within reach  OT Visit Diagnosis: Unsteadiness on feet (R26.81);Other abnormalities of gait and mobility (R26.89);Muscle weakness (generalized) (M62.81);Pain Pain - Right/Left: Right Pain - part of body: Leg                Time: 2637-8588 OT Time Calculation (min): 25 min Charges:  OT General Charges $OT Visit: 1 Visit OT Evaluation $OT Eval Low Complexity: 1 Low OT Treatments $Self Care/Home Management : 8-22 mins  William Quinn MSOT, OTR/L Acute Rehab Pager: [phone] Office: [phone]  Theodoro Grist William Quinn 10/21/2020, 11:33 AM

## 2020-10-22 LAB — CBC WITH DIFFERENTIAL/PLATELET
Abs Immature Granulocytes: 0.44 10*3/uL — ABNORMAL HIGH (ref 0.00–0.07)
Basophils Absolute: 0 10*3/uL (ref 0.0–0.1)
Basophils Relative: 0 %
Eosinophils Absolute: 0 10*3/uL (ref 0.0–0.5)
Eosinophils Relative: 0 %
HCT: 31.2 % — ABNORMAL LOW (ref 39.0–52.0)
Hemoglobin: 10.9 g/dL — ABNORMAL LOW (ref 13.0–17.0)
Immature Granulocytes: 3 %
Lymphocytes Relative: 15 %
Lymphs Abs: 2.4 10*3/uL (ref 0.7–4.0)
MCH: 29 pg (ref 26.0–34.0)
MCHC: 34.9 g/dL (ref 30.0–36.0)
MCV: 83 fL (ref 80.0–100.0)
Monocytes Absolute: 1.6 10*3/uL — ABNORMAL HIGH (ref 0.1–1.0)
Monocytes Relative: 10 %
Neutro Abs: 11.2 10*3/uL — ABNORMAL HIGH (ref 1.7–7.7)
Neutrophils Relative %: 72 %
Platelets: 231 10*3/uL (ref 150–400)
RBC: 3.76 MIL/uL — ABNORMAL LOW (ref 4.22–5.81)
RDW: 13.7 % (ref 11.5–15.5)
WBC: 15.7 10*3/uL — ABNORMAL HIGH (ref 4.0–10.5)
nRBC: 0 % (ref 0.0–0.2)

## 2020-10-22 LAB — RENAL FUNCTION PANEL
Albumin: 2.4 g/dL — ABNORMAL LOW (ref 3.5–5.0)
Anion gap: 15 (ref 5–15)
BUN: 124 mg/dL — ABNORMAL HIGH (ref 6–20)
CO2: 25 mmol/L (ref 22–32)
Calcium: 8.3 mg/dL — ABNORMAL LOW (ref 8.9–10.3)
Chloride: 96 mmol/L — ABNORMAL LOW (ref 98–111)
Creatinine, Ser: 11.21 mg/dL — ABNORMAL HIGH (ref 0.61–1.24)
GFR, Estimated: 6 mL/min — ABNORMAL LOW (ref >60)
Glucose, Bld: 90 mg/dL (ref 70–99)
Phosphorus: 7.2 mg/dL — ABNORMAL HIGH (ref 2.5–4.6)
Potassium: 3.7 mmol/L (ref 3.5–5.1)
Sodium: 136 mmol/L (ref 135–145)

## 2020-10-22 LAB — GLUCOSE, CAPILLARY
Glucose-Capillary: 109 mg/dL — ABNORMAL HIGH (ref 70–99)
Glucose-Capillary: 125 mg/dL — ABNORMAL HIGH (ref 70–99)
Glucose-Capillary: 130 mg/dL — ABNORMAL HIGH (ref 70–99)
Glucose-Capillary: 150 mg/dL — ABNORMAL HIGH (ref 70–99)
Glucose-Capillary: 83 mg/dL (ref 70–99)
Glucose-Capillary: 99 mg/dL (ref 70–99)

## 2020-10-22 MED ORDER — PREDNISONE 20 MG PO TABS
40.0000 mg | ORAL_TABLET | Freq: Every day | ORAL | Status: AC
  Administered 2020-10-23: 40 mg via ORAL
  Filled 2020-10-22: qty 2

## 2020-10-22 NOTE — Progress Notes (Signed)
PROGRESS NOTE    William Quinn  PPJ:[id number] DOB: 07/13/1986 DOA: 10/15/2020 PCP: Iven Finn., MD   Brief Narrative: 34 year old with past medical history significant for avascular necrosis, narcolepsy, type 2 diabetes who presented to the ED for evaluation of numbness of his right leg.  Patient reported his symptoms started the day prior to admission.  He report having right lower extremity pain numbness and tingling.  Patient numbness is in a stocking type pattern.  Patient was found to have acute kidney injury with elevated BUN and creatinine. He was found to have also rhabdomyolysis with CK more than 50,000.  Assessment & Plan:   Active Problems:   AKI (acute kidney injury) (HCC)   Lower extremity weakness   Hyponatremia   Narcolepsy   Avascular necrosis (HCC)   Type 2 diabetes mellitus without complication (HCC)   Rhabdomyolysis   Weakness of left lower extremity  1-AKI probably related to Rhabdomyolysis: Metabolic acidosis  Nephrology is following. Renal failure secondary to rhabdomyolysis. Nephrology has concern that covid vaccine might have cause rhabdo.  Had HD catheter placement on 12/16. No clear source for Rhabdo. He denies taking dextromethorphan recently.   Acute right lower extremity weakness numbness: CT lumbar spine no evidence of disc herniation. He was a started on Solu-Medrol.  This was discontinued. TSH 0.199, free T3 1.2 low   free T4 normal. Might be related to sick euthyroid. Needs follow up thyroid function test in 2 weeks.  B12 level low normal. Started  supplement.  Taper prednisone. Last dose tomorrow.  Doppler negative for DVT  History of a vascular necrosis: Presumably he was treated with long-term steroid for presumed multiple sclerosis.  In 2015 or 16 it was decided that he is did not have multiple sclerosis and oral steroids were discontinued.  Diabetes  type II: Continue with sliding scale insulin Continue to hold  Metformin.  Hyponatremia: holding IV fluids. Plan for IV lasix due to pleural effusion, dyspnea.   Questionable gout: Uric acid at 12: On prednisone daily now, taper prednisone.   Hyperkalemia: Received Lokelma Resolved.   Dyspnea; pleural effusion, atelectasis vs PNA.  Afebrile.  Incentive spirometry/  Will treated for PNA for 5 days.   Transaminases; likely related to Rhabdomyolysis. Trending down.   Estimated body mass index is 27.32 kg/m as calculated from the following:   Height as of this encounter: 5\' 8"  (1.727 m).   Weight as of this encounter: 81.5 kg.   DVT prophylaxis: Heparin Code Status: Full code Family Communication: Care discussed with patient Disposition Plan:  Status is: Inpatient  Remains inpatient appropriate because:IV treatments appropriate due to intensity of illness or inability to take PO   Dispo: The patient is from: Home              Anticipated d/c is to: Home              Anticipated d/c date is: 3 days              Patient currently is not medically stable to d/c.        Consultants:   Nephrology  Procedures:   None  Antimicrobials:    Subjective: He report left leg  pain.  Mild dyspnea. Congestion.   Objective: Vitals:   10/21/20 1957 10/21/20 2015 10/22/20 0523 10/22/20 0858  BP: 119/74 119/74 119/78 118/72  Pulse: (!) 47 (!) 49 (!) 50 (!) 49  Resp:  18 20 18   Temp: 98.9 F (37.2 C) 98.2  F (36.8 C) 98.7 F (37.1 C) (!) 97.5 F (36.4 C)  TempSrc:  Oral Oral Oral  SpO2: 94% 95% 96% 95%  Weight:      Height:        Intake/Output Summary (Last 24 hours) at 10/22/2020 1311 Last data filed at 10/22/2020 0718 Gross per 24 hour  Intake 240 ml  Output 2550 ml  Net -2310 ml   Filed Weights   10/15/20 1143 10/19/20 2027 10/20/20 2045  Weight: 65.8 kg 80.9 kg 81.5 kg    Examination:  General exam: NAD Respiratory system: B/L crackles.  Cardiovascular system: S 1, S 2 RRR Gastrointestinal system: BS  present,sfot, nt Central nervous system: Alert Extremities: No edema    Data Reviewed: I have personally reviewed following labs and imaging studies  CBC: Recent Labs  Lab 10/18/20 0532 10/19/20 0222 10/20/20 0348 10/21/20 0150 10/22/20 0455  WBC 11.6* 10.4 12.3* 13.3* 15.7*  NEUTROABS 9.0* 8.4* 9.2* 10.5* 11.2*  HGB 10.0* 10.8* 10.5* 10.7* 10.9*  HCT 28.8* 29.7* 28.5* 30.0* 31.2*  MCV 85.5 82.0 81.4 81.3 83.0  PLT 160 200 214 232 231   Basic Metabolic Panel: Recent Labs  Lab 10/17/20 0518 10/18/20 0532 10/19/20 0222 10/20/20 0348 10/21/20 0150 10/22/20 0455  NA 128* 131* 132* 134* 134* 136  K 5.2* 4.8 4.5 3.6 3.7 3.7  CL 96* 102 103 101 98 96*  CO2 20* 16* 17* 18* 23 25  GLUCOSE 112* 91 115* 112* 168* 90  BUN 87* 99* 109* 110* 119* 124*  CREATININE 7.82* 9.16* 10.62* 11.18* 11.34* 11.21*  CALCIUM 7.7* 7.5* 7.7* 7.9* 7.9* 8.3*  MG 2.7* 2.5* 2.3 2.4 2.3  --   PHOS 7.3* 7.6* 7.4* 7.7* 7.7* 7.2*   GFR: Estimated Creatinine Clearance: 8.8 mL/min (A) (by C-G formula based on SCr of 11.21 mg/dL (H)). Liver Function Tests: Recent Labs  Lab 10/17/20 0518 10/18/20 0532 10/19/20 0222 10/20/20 0348 10/21/20 0150 10/22/20 0455  AST 248* 107* 68* 41 38  --   ALT 156* 112* 89* 66* 63*  --   ALKPHOS 50 43 44 39 37*  --   BILITOT 0.7 0.5 0.8 0.6 0.7  --   PROT 5.4* 5.1* 4.7* 4.3* 4.3*  --   ALBUMIN 2.9* 2.6* 2.3* 2.2* 2.2* 2.4*   No results for input(s): LIPASE, AMYLASE in the last 168 hours. No results for input(s): AMMONIA in the last 168 hours. Coagulation Profile: No results for input(s): INR, PROTIME in the last 168 hours. Cardiac Enzymes: Recent Labs  Lab 10/17/20 0518 10/18/20 0532 10/19/20 0222 10/20/20 0348 10/21/20 0150  CKTOTAL 23,778* 9,665* 5,481* 3,330* 2,647*   BNP (last 3 results) No results for input(s): PROBNP in the last 8760 hours. HbA1C: No results for input(s): HGBA1C in the last 72 hours. CBG: Recent Labs  Lab 10/21/20 2004  10/21/20 2359 10/22/20 0335 10/22/20 0715 10/22/20 1125  GLUCAP 136* 109* 83 99 125*   Lipid Profile: No results for input(s): CHOL, HDL, LDLCALC, TRIG, CHOLHDL, LDLDIRECT in the last 72 hours. Thyroid Function Tests: Recent Labs    10/21/20 0822  TSH 0.948   Anemia Panel: No results for input(s): VITAMINB12, FOLATE, FERRITIN, TIBC, IRON, RETICCTPCT in the last 72 hours. Sepsis Labs: No results for input(s): PROCALCITON, LATICACIDVEN in the last 168 hours.  Recent Results (from the past 240 hour(s))  Resp Panel by RT-PCR (Flu A&B, Covid) Nasopharyngeal Swab     Status: None   Collection Time: 10/15/20  1:57 PM  Specimen: Nasopharyngeal Swab; Nasopharyngeal(NP) swabs in vial transport medium  Result Value Ref Range Status   SARS Coronavirus 2 by RT PCR NEGATIVE NEGATIVE Final    Comment: (NOTE) SARS-CoV-2 target nucleic acids are NOT DETECTED.  The SARS-CoV-2 RNA is generally detectable in upper respiratory specimens during the acute phase of infection. The lowest concentration of SARS-CoV-2 viral copies this assay can detect is 138 copies/mL. A negative result does not preclude SARS-Cov-2 infection and should not be used as the sole basis for treatment or other patient management decisions. A negative result may occur with  improper specimen collection/handling, submission of specimen other than nasopharyngeal swab, presence of viral mutation(s) within the areas targeted by this assay, and inadequate number of viral copies(<138 copies/mL). A negative result must be combined with clinical observations, patient history, and epidemiological information. The expected result is Negative.  Fact Sheet for Patients:  BloggerCourse.com  Fact Sheet for Healthcare Providers:  SeriousBroker.it  This test is no t yet approved or cleared by the [place] FDA and  has been authorized for detection and/or diagnosis of SARS-CoV-2  by FDA under an Emergency Use Authorization (EUA). This EUA will remain  in effect (meaning this test can be used) for the duration of the COVID-19 declaration under Section 564(b)(1) of the Act, 21 U.S.C.section 360bbb-3(b)(1), unless the authorization is terminated  or revoked sooner.       Influenza A by PCR NEGATIVE NEGATIVE Final   Influenza B by PCR NEGATIVE NEGATIVE Final    Comment: (NOTE) The Xpert Xpress SARS-CoV-2/FLU/RSV plus assay is intended as an aid in the diagnosis of influenza from Nasopharyngeal swab specimens and should not be used as a sole basis for treatment. Nasal washings and aspirates are unacceptable for Xpert Xpress SARS-CoV-2/FLU/RSV testing.  Fact Sheet for Patients: BloggerCourse.com  Fact Sheet for Healthcare Providers: SeriousBroker.it  This test is not yet approved or cleared by the [place] FDA and has been authorized for detection and/or diagnosis of SARS-CoV-2 by FDA under an Emergency Use Authorization (EUA). This EUA will remain in effect (meaning this test can be used) for the duration of the COVID-19 declaration under Section 564(b)(1) of the Act, 21 U.S.C. section 360bbb-3(b)(1), unless the authorization is terminated or revoked.  Performed at [facility], [address] [place], [place] [address]          Radiology Studies: IR Fluoro Guide CV Line Right  Result Date: 10/20/2020 INDICATION: 34 year old male referred for temporary hemodialysis catheter placement for treatment of rhabdomyolysis EXAM: IMAGE GUIDED TEMPORARY HEMODIALYSIS CATHETER PLACEMENT MEDICATIONS: None ANESTHESIA/SEDATION: None FLUOROSCOPY TIME:  Fluoroscopy Time: 0 minutes 6 seconds (1 mGy). COMPLICATIONS: None PROCEDURE: Informed written consent was obtained from the patient and the patient's family after a thorough discussion of the procedural risks, benefits and alternatives. All questions  were addressed. A timeout was performed prior to the initiation of the procedure. The right neck and chest was prepped with chlorhexidine, and draped in the usual sterile fashion using maximum barrier technique (cap and mask, sterile gown, sterile gloves, large sterile sheet, hand hygiene and cutaneous antiseptic). Local anesthesia was attained by infiltration with 1% lidocaine without epinephrine. Ultrasound demonstrated patency of the right internal jugular vein, and this was documented with an image. Under real-time ultrasound guidance, this vein was accessed with a 21 gauge micropuncture needle and image documentation was performed. A small dermatotomy was made at the access site with an 11 scalpel. A 0.018" wire was advanced into the SVC  and the access needle exchanged for a 110F micropuncture vascular sheath. The 0.018" wire was then removed and a 0.035" wire advanced into the IVC. We elected to place a 16 cm temp catheter. Skin and subcutaneous tissues were serially dilated. Catheter was placed on the wire. The catheter tip is positioned in the upper right atrium. This was documented with a spot image. Both ports of the hemodialysis catheter were then tested for excellent function. The ports were then locked with heparinized lock. Patient tolerated the procedure well and remained hemodynamically stable throughout. No complications were encountered and no significant blood loss was encountered. IMPRESSION: Status post image guided placement of 16 cm non tunneled hemodialysis catheter. Signed, Yvone Neu. Reyne Dumas, RPVI [facility] Radiology Electronically Signed   By: Gilmer Mor D.O.   On: 10/20/2020 16:39   IR US Guide Vasc Access Right  Result Date: 10/20/2020 INDICATION: 34 year old male referred for temporary hemodialysis catheter placement for treatment of rhabdomyolysis EXAM: IMAGE GUIDED TEMPORARY HEMODIALYSIS CATHETER PLACEMENT MEDICATIONS: None  ANESTHESIA/SEDATION: None FLUOROSCOPY TIME:  Fluoroscopy Time: 0 minutes 6 seconds (1 mGy). COMPLICATIONS: None PROCEDURE: Informed written consent was obtained from the patient and the patient's family after a thorough discussion of the procedural risks, benefits and alternatives. All questions were addressed. A timeout was performed prior to the initiation of the procedure. The right neck and chest was prepped with chlorhexidine, and draped in the usual sterile fashion using maximum barrier technique (cap and mask, sterile gown, sterile gloves, large sterile sheet, hand hygiene and cutaneous antiseptic). Local anesthesia was attained by infiltration with 1% lidocaine without epinephrine. Ultrasound demonstrated patency of the right internal jugular vein, and this was documented with an image. Under real-time ultrasound guidance, this vein was accessed with a 21 gauge micropuncture needle and image documentation was performed. A small dermatotomy was made at the access site with an 11 scalpel. A 0.018" wire was advanced into the SVC and the access needle exchanged for a 110F micropuncture vascular sheath. The 0.018" wire was then removed and a 0.035" wire advanced into the IVC. We elected to place a 16 cm temp catheter. Skin and subcutaneous tissues were serially dilated. Catheter was placed on the wire. The catheter tip is positioned in the upper right atrium. This was documented with a spot image. Both ports of the hemodialysis catheter were then tested for excellent function. The ports were then locked with heparinized lock. Patient tolerated the procedure well and remained hemodynamically stable throughout. No complications were encountered and no significant blood loss was encountered. IMPRESSION: Status post image guided placement of 16 cm non tunneled hemodialysis catheter. Signed, Yvone Neu. Reyne Dumas, RPVI [facility] Radiology Electronically Signed   By: Gilmer Mor D.O.   On: 10/20/2020 16:39   VAS [place] LOWER EXTREMITY VENOUS (DVT)  Result Date: 10/21/2020  Lower Venous DVT Study Indications: Edema.  Risk Factors: None identified. Comparison Study: No prior studies. Performing Technologist: Chanda Busing RVT  Examination Guidelines: A complete evaluation includes B-mode imaging, spectral Doppler, color Doppler, and power Doppler as needed of all accessible portions of each vessel. Bilateral testing is considered an integral part of a complete examination. Limited examinations for reoccurring indications may be performed as noted. The reflux portion of the exam is performed with the patient in reverse Trendelenburg.  +---------+---------------+---------+-----------+----------+--------------+ RIGHT    CompressibilityPhasicitySpontaneityPropertiesThrombus Aging +---------+---------------+---------+-----------+----------+--------------+ CFV      Full  Yes      Yes                                 +---------+---------------+---------+-----------+----------+--------------+ SFJ      Full                                                        +---------+---------------+---------+-----------+----------+--------------+ FV Prox  Full                                                        +---------+---------------+---------+-----------+----------+--------------+ FV Mid   Full                                                        +---------+---------------+---------+-----------+----------+--------------+ FV DistalFull                                                        +---------+---------------+---------+-----------+----------+--------------+ PFV      Full                                                        +---------+---------------+---------+-----------+----------+--------------+ POP      Full           Yes      Yes                                  +---------+---------------+---------+-----------+----------+--------------+ PTV      Full                                                        +---------+---------------+---------+-----------+----------+--------------+ PERO     Full                                                        +---------+---------------+---------+-----------+----------+--------------+   +---------+---------------+---------+-----------+----------+--------------+ LEFT     CompressibilityPhasicitySpontaneityPropertiesThrombus Aging +---------+---------------+---------+-----------+----------+--------------+ CFV      Full           Yes      Yes                                 +---------+---------------+---------+-----------+----------+--------------+ SFJ  Full                                                        +---------+---------------+---------+-----------+----------+--------------+ FV Prox  Full                                                        +---------+---------------+---------+-----------+----------+--------------+ FV Mid   Full                                                        +---------+---------------+---------+-----------+----------+--------------+ FV DistalFull                                                        +---------+---------------+---------+-----------+----------+--------------+ PFV      Full                                                        +---------+---------------+---------+-----------+----------+--------------+ POP      Full           Yes      Yes                                 +---------+---------------+---------+-----------+----------+--------------+ PTV      Full                                                        +---------+---------------+---------+-----------+----------+--------------+ PERO     Full                                                         +---------+---------------+---------+-----------+----------+--------------+     Summary: RIGHT: - There is no evidence of deep vein thrombosis in the lower extremity.  - No cystic structure found in the popliteal fossa.  LEFT: - There is no evidence of deep vein thrombosis in the lower extremity.  - No cystic structure found in the popliteal fossa.  *See table(s) above for measurements and observations. Electronically signed by Sherald Hess MD on 10/21/2020 at 5:03:41 PM.    Final         Scheduled Meds: . Chlorhexidine Gluconate Cloth  6 each Topical Daily  . doxycycline  100 mg Oral Q12H  . fluticasone  1 spray Each Nare Daily  . heparin  5,000  Units Subcutaneous Q8H  . insulin aspart  0-9 Units Subcutaneous Q4H  . predniSONE  40 mg Oral Q breakfast  . vitamin B-12  100 mcg Oral Daily   Continuous Infusions: . cefTRIAXone (ROCEPHIN)  IV 2 g (10/22/20 1011)     LOS: 7 days    Time spent: 35 minutes.     Alba CoryBelkys A Olivianna Higley, MD [facility]   If 7PM-7AM, please contact night-coverage [email or web address]  10/22/2020, 1:11 PM

## 2020-10-22 NOTE — Evaluation (Signed)
Physical Therapy Evaluation Patient Details Name: William Quinn MRN: [id number] DOB: Sep 08, 1986 Today's Date: 10/22/2020   History of Present Illness  34 yo male presenting to ED with numbness and pain of RLE. found to have acute kidney injury with elevated BUN and creatinine. PMH including avascular necrosis, narcolepsy, and type 2 diabetes.  Clinical Impression  Patient received in bed, states that he doesn't want to walk, but will if he has to. Patient is mod independent with bed mobility. Transfers with supervision. He ambulated 250 feet with RW and supervision. Patient reports R LE pain with mobility, but no significant difficulty noted with ambulation. He will continue to benefit from skilled PT while here to improve activity tolerance and improve strength.        Follow Up Recommendations No PT follow up    Equipment Recommendations  Other (comment) (dependent on progress, hopeful he will progress to none.)    Recommendations for Other Services       Precautions / Restrictions Precautions Precautions: Fall Restrictions Weight Bearing Restrictions: No      Mobility  Bed Mobility Overal bed mobility: Modified Independent                  Transfers Overall transfer level: Needs assistance Equipment used: Rolling walker (2 wheeled) Transfers: Sit to/from Stand Sit to Stand: Supervision         General transfer comment: Supervision for safety.  Ambulation/Gait Ambulation/Gait assistance: Supervision Gait Distance (Feet): 250 Feet Assistive device: Rolling walker (2 wheeled) Gait Pattern/deviations: Wide base of support;Step-through pattern;Decreased step length - right;Decreased step length - left Gait velocity: slowed   General Gait Details: patient requires supervision to close guard at times for safety. No significant difficulty noted with ambulation.  Stairs            Wheelchair Mobility    Modified Rankin (Stroke Patients Only)        Balance Overall balance assessment: Modified Independent Sitting-balance support: Feet supported Sitting balance-Leahy Scale: Good     Standing balance support: Bilateral upper extremity supported;During functional activity Standing balance-Leahy Scale: Fair Standing balance comment: Patient is reliant on B ue support at this time.                             Pertinent Vitals/Pain Pain Assessment: Faces Faces Pain Scale: Hurts a little bit Pain Location: R LE Pain Descriptors / Indicators: Grimacing;Guarding;Discomfort Pain Intervention(s): Monitored during session;Limited activity within patient's tolerance    Home Living Family/patient expects to be discharged to:: Private residence Living Arrangements: Non-relatives/Friends Available Help at Discharge: Friend(s);Available PRN/intermittently Type of Home: House Home Access: Stairs to enter   [employer] of Steps: "a couple" Home Layout: One level Home Equipment: None Additional Comments: Lives with roommate Elijah Birk in a one story home. Pt vague about information and requiring several follow up questions.    Prior Function Level of Independence: Independent         Comments: Was independent with BADLs, IADLs, and working as a Surveyor, minerals (?). Pt reporting he was independent and working but became vague with information on job type     Hand Dominance        Extremity/Trunk Assessment   Upper Extremity Assessment Upper Extremity Assessment: Overall WFL for tasks assessed    Lower Extremity Assessment Lower Extremity Assessment: Overall WFL for tasks assessed    Cervical / Trunk Assessment Cervical / Trunk Assessment: Normal;Other exceptions Cervical / Trunk  Exceptions: flexed head posture  Communication   Communication: No difficulties  Cognition Arousal/Alertness: Awake/alert Behavior During Therapy: Flat affect Overall Cognitive Status: Within Functional Limits for tasks assessed                                  General Comments: Patient does not like to offer information. When asked what is bothering him he said he would rather not say.      General Comments      Exercises     Assessment/Plan    PT Assessment Patient needs continued PT services  PT Problem List Decreased strength;Decreased mobility;Decreased activity tolerance;Decreased balance;Pain       PT Treatment Interventions DME instruction;Therapeutic activities;Gait training;Therapeutic exercise;Patient/family education;Functional mobility training;Stair training    PT Goals (Current goals can be found in the Care Plan section)  Acute Rehab PT Goals Patient Stated Goal: feel better PT Goal Formulation: With patient Time For Goal Achievement: 11/04/20 Potential to Achieve Goals: Good    Frequency Min 3X/week   Barriers to discharge Decreased caregiver support      Co-evaluation               AM-PAC PT "6 Clicks" Mobility  Outcome Measure Help needed turning from your back to your side while in a flat bed without using bedrails?: None Help needed moving from lying on your back to sitting on the side of a flat bed without using bedrails?: None Help needed moving to and from a bed to a chair (including a wheelchair)?: None Help needed standing up from a chair using your arms (e.g., wheelchair or bedside chair)?: None Help needed to walk in hospital room?: A Little Help needed climbing 3-5 steps with a railing? : A Little 6 Click Score: 22    End of Session   Activity Tolerance: Patient tolerated treatment well Patient left: in bed;with call bell/phone within reach Nurse Communication: Mobility status PT Visit Diagnosis: Muscle weakness (generalized) (M62.81);Difficulty in walking, not elsewhere classified (R26.2);Pain Pain - Right/Left: Right Pain - part of body: Leg    Time: 1229-1252 PT Time Calculation (min) (ACUTE ONLY): 23 min   Charges:   PT Evaluation $PT  Eval Moderate Complexity: 1 Mod PT Treatments $Gait Training: 8-22 mins        Gaby Harney, PT, GCS 10/22/20,1:04 PM

## 2020-10-22 NOTE — Progress Notes (Signed)
[facility] Progress Note  Subjective: 3.3 L UOP yest w lasix x 1. No SOB or confusion. No new c/o's.   Vitals:   10/21/20 1957 10/21/20 2015 10/22/20 0523 10/22/20 0858  BP: 119/74 119/74 119/78 118/72  Pulse: (!) 47 (!) 49 (!) 50 (!) 49  Resp:  18 20 18   Temp: 98.9 F (37.2 C) 98.2 F (36.8 C) 98.7 F (37.1 C) (!) 97.5 F (36.4 C)  TempSrc:  Oral Oral Oral  SpO2: 94% 95% 96% 95%  Weight:      Height:        Exam:   alert, nad   no jvd  Chest cta bilat  Cor reg no RG  Abd soft ntnd no ascites   Ext no LE edema   Alert, NF, ox3  UA - prot 30, 0-5 rbc, large Hb, 6-10 wbc   Renal - IMPRESSION: Mild increase in echogenicity of the bilateral kidneys as can be seen in medical renal disease. No other acute finding.  Assessment/ Plan: 1. AKI - due to ATN oliguric due to acute rhabdomyolysis w/ CPK > 50,000. Renal US unremarkable. UA large Hb w/ small RBC's and wbc's. CReat 5.6 on admit > 7.8 > 9.1 > 10.6 > 11.1 > 11.3 > 111 today. Some mild nausea and lethargy, no worse or better today. Will hold on further lasix. No indication for HD yet, hopefully is recovering function. Check labs in am.  2. Acute rhabdomyolysis - suspect this is complication of COVID vaccination.  There are case reports of acute rhabdo after COVID vaccination.  Improving.  3. Hyperkalemia - resolved 4. Transaminitis - rhabdo will cause ^^ AST / ALT that looks like liver damage but is really due to muscle damage     William Quinn 10/22/2020, 11:59 AM   Recent Labs  Lab 10/21/20 0150 10/22/20 0455  K 3.7 3.7  BUN 119* 124*  CREATININE 11.34* 11.21*  CALCIUM 7.9* 8.3*  PHOS 7.7* 7.2*  HGB 10.7* 10.9*   Inpatient medications: . Chlorhexidine Gluconate Cloth  6 each Topical Daily  . doxycycline  100 mg Oral Q12H  . fluticasone  1 spray Each Nare Daily  . heparin  5,000 Units Subcutaneous Q8H  . insulin aspart  0-9 Units Subcutaneous Q4H  . predniSONE  40 mg Oral Q breakfast  .  vitamin B-12  100 mcg Oral Daily   . cefTRIAXone (ROCEPHIN)  IV 2 g (10/22/20 1011)   acetaminophen, fentaNYL (SUBLIMAZE) injection, influenza vac split quadrivalent PF, lidocaine, ondansetron (ZOFRAN) IV, pneumococcal 23 valent vaccine, traMADol

## 2020-10-23 LAB — CBC WITH DIFFERENTIAL/PLATELET
Abs Immature Granulocytes: 0.44 10*3/uL — ABNORMAL HIGH (ref 0.00–0.07)
Basophils Absolute: 0 10*3/uL (ref 0.0–0.1)
Basophils Relative: 0 %
Eosinophils Absolute: 0.1 10*3/uL (ref 0.0–0.5)
Eosinophils Relative: 0 %
HCT: 28.3 % — ABNORMAL LOW (ref 39.0–52.0)
Hemoglobin: 10.3 g/dL — ABNORMAL LOW (ref 13.0–17.0)
Immature Granulocytes: 3 %
Lymphocytes Relative: 14 %
Lymphs Abs: 2.1 10*3/uL (ref 0.7–4.0)
MCH: 30.1 pg (ref 26.0–34.0)
MCHC: 36.4 g/dL — ABNORMAL HIGH (ref 30.0–36.0)
MCV: 82.7 fL (ref 80.0–100.0)
Monocytes Absolute: 1.5 10*3/uL — ABNORMAL HIGH (ref 0.1–1.0)
Monocytes Relative: 11 %
Neutro Abs: 10.2 10*3/uL — ABNORMAL HIGH (ref 1.7–7.7)
Neutrophils Relative %: 72 %
Platelets: 212 10*3/uL (ref 150–400)
RBC: 3.42 MIL/uL — ABNORMAL LOW (ref 4.22–5.81)
RDW: 14.1 % (ref 11.5–15.5)
WBC: 14.3 10*3/uL — ABNORMAL HIGH (ref 4.0–10.5)
nRBC: 0 % (ref 0.0–0.2)

## 2020-10-23 LAB — RENAL FUNCTION PANEL
Albumin: 2.3 g/dL — ABNORMAL LOW (ref 3.5–5.0)
Anion gap: 14 (ref 5–15)
BUN: 119 mg/dL — ABNORMAL HIGH (ref 6–20)
CO2: 24 mmol/L (ref 22–32)
Calcium: 8.2 mg/dL — ABNORMAL LOW (ref 8.9–10.3)
Chloride: 98 mmol/L (ref 98–111)
Creatinine, Ser: 10.2 mg/dL — ABNORMAL HIGH (ref 0.61–1.24)
GFR, Estimated: 6 mL/min — ABNORMAL LOW (ref >60)
Glucose, Bld: 136 mg/dL — ABNORMAL HIGH (ref 70–99)
Phosphorus: 7.1 mg/dL — ABNORMAL HIGH (ref 2.5–4.6)
Potassium: 3.5 mmol/L (ref 3.5–5.1)
Sodium: 136 mmol/L (ref 135–145)

## 2020-10-23 LAB — GLUCOSE, CAPILLARY
Glucose-Capillary: 67 mg/dL — ABNORMAL LOW (ref 70–99)
Glucose-Capillary: 126 mg/dL — ABNORMAL HIGH (ref 70–99)
Glucose-Capillary: 161 mg/dL — ABNORMAL HIGH (ref 70–99)
Glucose-Capillary: 164 mg/dL — ABNORMAL HIGH (ref 70–99)

## 2020-10-23 MED ORDER — INSULIN ASPART 100 UNIT/ML ~~LOC~~ SOLN
0.0000 [IU] | Freq: Three times a day (TID) | SUBCUTANEOUS | Status: DC
  Administered 2020-10-23: 2 [IU] via SUBCUTANEOUS
  Administered 2020-10-23: 1 [IU] via SUBCUTANEOUS

## 2020-10-23 NOTE — Progress Notes (Signed)
[facility] Progress Note  Subjective: 3.3 L UOP yest w lasix x 1. No SOB or confusion. No new c/o's.   Vitals:   10/22/20 1640 10/22/20 2135 10/23/20 0545 10/23/20 1006  BP: 124/70 131/78 107/70 119/65  Pulse: (!) 49 (!) 51 (!) 50 (!) 59  Resp: 18 18 16    Temp: 98.2 F (36.8 C) 98.1 F (36.7 C) 97.8 F (36.6 C) 98.6 F (37 C)  TempSrc:  Oral Oral Oral  SpO2: 95% 93% 96% 95%  Weight:  81.6 kg    Height:        Exam:   alert, nad   no jvd  Chest cta bilat  Cor reg no RG  Abd soft ntnd no ascites   Ext no LE edema   Alert, NF, ox3  UA - prot 30, 0-5 rbc, large Hb, 6-10 wbc   Renal - IMPRESSION: Mild increase in echogenicity of the bilateral kidneys as can be seen in medical renal disease. No other acute finding.  Assessment/ Plan: 1. AKI - due to ATN oliguric due to acute rhabdomyolysis w/ CPK > 50,000. Renal US unremarkable. UA large Hb w/ small RBC's and wbc's. CReat 5.6 on admit > 7.8 > 9.1 > 10.6 > 11.1 > 11.3 > 11.1 yest and 10.2 today. Nausea/ lethargy resolving, feeling better. Pt is recovering. Should continue to recover from here. Has R IJ temp cath that will need to come out prior to dc. Will follow.   2. Acute rhabdomyolysis - suspect this is complication of COVID vaccination.  There are case reports of acute rhabdo after COVID vaccination.  Improving.  3. Vol overload - sig edema LE's/ scrotum/ penis. This will take care of itself over the next couple of weeks.  4. Hyperkalemia - resolved   Rob Kanoe Wanner 10/23/2020, 11:56 AM   Recent Labs  Lab 10/22/20 0455 10/23/20 0209  K 3.7 3.5  BUN 124* 119*  CREATININE 11.21* 10.20*  CALCIUM 8.3* 8.2*  PHOS 7.2* 7.1*  HGB 10.9* 10.3*   Inpatient medications: . Chlorhexidine Gluconate Cloth  6 each Topical Daily  . doxycycline  100 mg Oral Q12H  . fluticasone  1 spray Each Nare Daily  . heparin  5,000 Units Subcutaneous Q8H  . insulin aspart  0-9 Units Subcutaneous TID AC  . vitamin B-12  100  mcg Oral Daily   . cefTRIAXone (ROCEPHIN)  IV 2 g (10/23/20 1146)   acetaminophen, fentaNYL (SUBLIMAZE) injection, influenza vac split quadrivalent PF, lidocaine, ondansetron (ZOFRAN) IV, pneumococcal 23 valent vaccine, traMADol

## 2020-10-23 NOTE — Progress Notes (Signed)
PROGRESS NOTE    Tibor Lemmons  ZOX:[id number] DOB: [birth year] DOA: 10/15/2020 PCP: Iven Finn., MD   Brief Narrative: 34 year old with past medical history significant for avascular necrosis, narcolepsy, type 2 diabetes who presented to the ED for evaluation of numbness of his right leg.  Patient reported his symptoms started the day prior to admission.  He report having right lower extremity pain numbness and tingling.  Patient numbness is in a stocking type pattern.  Patient was found to have acute kidney injury with elevated BUN and creatinine. He was found to have also rhabdomyolysis with CK more than 50,000.  Assessment & Plan:   Active Problems:   AKI (acute kidney injury) (HCC)   Lower extremity weakness   Hyponatremia   Narcolepsy   Avascular necrosis (HCC)   Type 2 diabetes mellitus without complication (HCC)   Rhabdomyolysis   Weakness of left lower extremity  1-AKI probably related to Rhabdomyolysis: Metabolic acidosis  Nephrology is following. Renal failure secondary to rhabdomyolysis. Nephrology has concern that covid vaccine might have cause rhabdo.  Had HD catheter placement on 12/16. No clear source for Rhabdo. He denies taking dextromethorphan recently.  Renal function improved today. 2 L urine out put yesterday.   Acute right lower extremity weakness numbness: CT lumbar spine no evidence of disc herniation. He was a started on Solu-Medrol.  This was discontinued. TSH 0.199, free T3 1.2 low   free T4 normal. Might be related to sick euthyroid. Needs follow up thyroid function test in 2 weeks.  B12 level low normal. Started  supplement.  Taper prednisone. Last dose today Doppler negative for DVT Numbness improved.   History of a vascular necrosis: Presumably he was treated with long-term steroid for presumed multiple sclerosis.  In 2015 or 16 it was decided that he is did not have multiple sclerosis and oral steroids were discontinued.  Diabetes  type  II: Continue with sliding scale insulin Continue to hold Metformin.  Hyponatremia: holding IV fluids. Plan for IV lasix due to pleural effusion, dyspnea.   Questionable gout: Uric acid at 12: On prednisone daily now, taper prednisone.   Hyperkalemia: Received Lokelma Resolved.   Dyspnea; pleural effusion, atelectasis vs PNA.  Afebrile.  Incentive spirometry/  Will treated for PNA for 5 days.  WBC trending down.   Transaminases; likely related to Rhabdomyolysis. Trending down.   Estimated body mass index is 27.34 kg/m as calculated from the following:   Height as of this encounter:  (1.727 m).   Weight as of this encounter: 81.6 kg.   DVT prophylaxis: Heparin Code Status: Full code Family Communication: Care discussed with patient Disposition Plan:  Status is: Inpatient  Remains inpatient appropriate because:IV treatments appropriate due to intensity of illness or inability to take PO   Dispo: The patient is from: Home              Anticipated d/c is to: Home              Anticipated d/c date is: 3 days              Patient currently is not medically stable to d/c.        Consultants:   Nephrology  Procedures:   None  Antimicrobials:    Subjective: He is feeling better, denies dyspnea  Objective: Vitals:   10/22/20 1640 10/22/20 2135 10/23/20 0545 10/23/20 1006  BP: 124/70 131/78 107/70 119/65  Pulse: (!) 49 (!) 51 (!) 50 (!) 59  Resp: 18 18 16    Temp: 98.2 F (36.8 C) 98.1 F (36.7 C) 97.8 F (36.6 C) 98.6 F (37 C)  TempSrc:  Oral Oral Oral  SpO2: 95% 93% 96% 95%  Weight:  81.6 kg    Height:        Intake/Output Summary (Last 24 hours) at 10/23/2020 1322 Last data filed at 10/23/2020 0900 Gross per 24 hour  Intake 940 ml  Output 2225 ml  Net -1285 ml   Filed Weights   10/19/20 2027 10/20/20 2045 10/22/20 2135  Weight: 80.9 kg 81.5 kg 81.6 kg    Examination:  General exam: NAD Respiratory system: CTA Cardiovascular  system: S 1, S 2 RRR Gastrointestinal system: BS present, soft, nt Central nervous system: alert Extremities: No edema    Data Reviewed: I have personally reviewed following labs and imaging studies  CBC: Recent Labs  Lab 10/19/20 0222 10/20/20 0348 10/21/20 0150 10/22/20 0455 10/23/20 0209  WBC 10.4 12.3* 13.3* 15.7* 14.3*  NEUTROABS 8.4* 9.2* 10.5* 11.2* 10.2*  HGB 10.8* 10.5* 10.7* 10.9* 10.3*  HCT 29.7* 28.5* 30.0* 31.2* 28.3*  MCV 82.0 81.4 81.3 83.0 82.7  PLT 200 214 232 231 212   Basic Metabolic Panel: Recent Labs  Lab 10/17/20 0518 10/18/20 0532 10/19/20 0222 10/20/20 0348 10/21/20 0150 10/22/20 0455 10/23/20 0209  NA 128* 131* 132* 134* 134* 136 136  K 5.2* 4.8 4.5 3.6 3.7 3.7 3.5  CL 96* 102 103 101 98 96* 98  CO2 20* 16* 17* 18* 23 25 24   GLUCOSE 112* 91 115* 112* 168* 90 136*  BUN 87* 99* 109* 110* 119* 124* 119*  CREATININE 7.82* 9.16* 10.62* 11.18* 11.34* 11.21* 10.20*  CALCIUM 7.7* 7.5* 7.7* 7.9* 7.9* 8.3* 8.2*  MG 2.7* 2.5* 2.3 2.4 2.3  --   --   PHOS 7.3* 7.6* 7.4* 7.7* 7.7* 7.2* 7.1*   GFR: Estimated Creatinine Clearance: 9.7 mL/min (A) (by C-G formula based on SCr of 10.2 mg/dL (H)). Liver Function Tests: Recent Labs  Lab 10/17/20 0518 10/18/20 0532 10/19/20 0222 10/20/20 0348 10/21/20 0150 10/22/20 0455 10/23/20 0209  AST 248* 107* 68* 41 38  --   --   ALT 156* 112* 89* 66* 63*  --   --   ALKPHOS 50 43 44 39 37*  --   --   BILITOT 0.7 0.5 0.8 0.6 0.7  --   --   PROT 5.4* 5.1* 4.7* 4.3* 4.3*  --   --   ALBUMIN 2.9* 2.6* 2.3* 2.2* 2.2* 2.4* 2.3*   No results for input(s): LIPASE, AMYLASE in the last 168 hours. No results for input(s): AMMONIA in the last 168 hours. Coagulation Profile: No results for input(s): INR, PROTIME in the last 168 hours. Cardiac Enzymes: Recent Labs  Lab 10/17/20 0518 10/18/20 0532 10/19/20 0222 10/20/20 0348 10/21/20 0150  CKTOTAL 23,778* 9,665* 5,481* 3,330* 2,647*   BNP (last 3 results) No  results for input(s): PROBNP in the last 8760 hours. HbA1C: No results for input(s): HGBA1C in the last 72 hours. CBG: Recent Labs  Lab 10/22/20 1125 10/22/20 1638 10/22/20 2137 10/23/20 0656 10/23/20 1204  GLUCAP 125* 130* 150* 67* 126*   Lipid Profile: No results for input(s): CHOL, HDL, LDLCALC, TRIG, CHOLHDL, LDLDIRECT in the last 72 hours. Thyroid Function Tests: Recent Labs    10/21/20 0822  TSH 0.948   Anemia Panel: No results for input(s): VITAMINB12, FOLATE, FERRITIN, TIBC, IRON, RETICCTPCT in the last 72 hours. Sepsis Labs: No results  for input(s): PROCALCITON, LATICACIDVEN in the last 168 hours.  Recent Results (from the past 240 hour(s))  Resp Panel by RT-PCR (Flu A&B, Covid) Nasopharyngeal Swab     Status: None   Collection Time: 10/15/20  1:57 PM   Specimen: Nasopharyngeal Swab; Nasopharyngeal(NP) swabs in vial transport medium  Result Value Ref Range Status   SARS Coronavirus 2 by RT PCR NEGATIVE NEGATIVE Final    Comment: (NOTE) SARS-CoV-2 target nucleic acids are NOT DETECTED.  The SARS-CoV-2 RNA is generally detectable in upper respiratory specimens during the acute phase of infection. The lowest concentration of SARS-CoV-2 viral copies this assay can detect is 138 copies/mL. A negative result does not preclude SARS-Cov-2 infection and should not be used as the sole basis for treatment or other patient management decisions. A negative result may occur with  improper specimen collection/handling, submission of specimen other than nasopharyngeal swab, presence of viral mutation(s) within the areas targeted by this assay, and inadequate number of viral copies(<138 copies/mL). A negative result must be combined with clinical observations, patient history, and epidemiological information. The expected result is Negative.  Fact Sheet for Patients:  BloggerCourse.com  Fact Sheet for Healthcare Providers:   SeriousBroker.it  This test is no t yet approved or cleared by the [place] FDA and  has been authorized for detection and/or diagnosis of SARS-CoV-2 by FDA under an Emergency Use Authorization (EUA). This EUA will remain  in effect (meaning this test can be used) for the duration of the COVID-19 declaration under Section 564(b)(1) of the Act, 21 U.S.C.section 360bbb-3(b)(1), unless the authorization is terminated  or revoked sooner.       Influenza A by PCR NEGATIVE NEGATIVE Final   Influenza B by PCR NEGATIVE NEGATIVE Final    Comment: (NOTE) The Xpert Xpress SARS-CoV-2/FLU/RSV plus assay is intended as an aid in the diagnosis of influenza from Nasopharyngeal swab specimens and should not be used as a sole basis for treatment. Nasal washings and aspirates are unacceptable for Xpert Xpress SARS-CoV-2/FLU/RSV testing.  Fact Sheet for Patients: BloggerCourse.com  Fact Sheet for Healthcare Providers: SeriousBroker.it  This test is not yet approved or cleared by the [place] FDA and has been authorized for detection and/or diagnosis of SARS-CoV-2 by FDA under an Emergency Use Authorization (EUA). This EUA will remain in effect (meaning this test can be used) for the duration of the COVID-19 declaration under Section 564(b)(1) of the Act, 21 U.S.C. section 360bbb-3(b)(1), unless the authorization is terminated or revoked.  Performed at [facility], [address], [place], [place] [address]          Radiology Studies: VAS [place] LOWER EXTREMITY VENOUS (DVT)  Result Date: 10/21/2020  Lower Venous DVT Study Indications: Edema.  Risk Factors: None identified. Comparison Study: No prior studies. Performing Technologist: Chanda Busing RVT  Examination Guidelines: A complete evaluation includes B-mode imaging, spectral Doppler, color Doppler, and power Doppler as needed of all  accessible portions of each vessel. Bilateral testing is considered an integral part of a complete examination. Limited examinations for reoccurring indications may be performed as noted. The reflux portion of the exam is performed with the patient in reverse Trendelenburg.  +---------+---------------+---------+-----------+----------+--------------+ RIGHT    CompressibilityPhasicitySpontaneityPropertiesThrombus Aging +---------+---------------+---------+-----------+----------+--------------+ CFV      Full           Yes      Yes                                 +---------+---------------+---------+-----------+----------+--------------+  SFJ      Full                                                        +---------+---------------+---------+-----------+----------+--------------+ FV Prox  Full                                                        +---------+---------------+---------+-----------+----------+--------------+ FV Mid   Full                                                        +---------+---------------+---------+-----------+----------+--------------+ FV DistalFull                                                        +---------+---------------+---------+-----------+----------+--------------+ PFV      Full                                                        +---------+---------------+---------+-----------+----------+--------------+ POP      Full           Yes      Yes                                 +---------+---------------+---------+-----------+----------+--------------+ PTV      Full                                                        +---------+---------------+---------+-----------+----------+--------------+ PERO     Full                                                        +---------+---------------+---------+-----------+----------+--------------+   +---------+---------------+---------+-----------+----------+--------------+  LEFT     CompressibilityPhasicitySpontaneityPropertiesThrombus Aging +---------+---------------+---------+-----------+----------+--------------+ CFV      Full           Yes      Yes                                 +---------+---------------+---------+-----------+----------+--------------+ SFJ      Full                                                        +---------+---------------+---------+-----------+----------+--------------+  FV Prox  Full                                                        +---------+---------------+---------+-----------+----------+--------------+ FV Mid   Full                                                        +---------+---------------+---------+-----------+----------+--------------+ FV DistalFull                                                        +---------+---------------+---------+-----------+----------+--------------+ PFV      Full                                                        +---------+---------------+---------+-----------+----------+--------------+ POP      Full           Yes      Yes                                 +---------+---------------+---------+-----------+----------+--------------+ PTV      Full                                                        +---------+---------------+---------+-----------+----------+--------------+ PERO     Full                                                        +---------+---------------+---------+-----------+----------+--------------+     Summary: RIGHT: - There is no evidence of deep vein thrombosis in the lower extremity.  - No cystic structure found in the popliteal fossa.  LEFT: - There is no evidence of deep vein thrombosis in the lower extremity.  - No cystic structure found in the popliteal fossa.  *See table(s) above for measurements and observations. Electronically signed by Sherald Hess MD on 10/21/2020 at 5:03:41 PM.    Final          Scheduled Meds: . Chlorhexidine Gluconate Cloth  6 each Topical Daily  . doxycycline  100 mg Oral Q12H  . fluticasone  1 spray Each Nare Daily  . heparin  5,000 Units Subcutaneous Q8H  . insulin aspart  0-9 Units Subcutaneous TID AC  . vitamin B-12  100 mcg Oral Daily   Continuous Infusions: . cefTRIAXone (ROCEPHIN)  IV 2 g (10/23/20 1146)     LOS: 8 days    Time spent: 35 minutes.     Alba Cory, MD  [facility]   If 7PM-7AM, please contact night-coverage [email or web address]  10/23/2020, 1:22 PM

## 2020-10-23 NOTE — Progress Notes (Signed)
Occupational Therapy Treatment Patient Details Name: William Quinn MRN: [id number] DOB: 1985-12-15 Today's Date: 10/23/2020    History of present illness 34 yo male presenting to ED with numbness and pain of RLE. found to have acute kidney injury with elevated BUN and creatinine. PMH including avascular necrosis, narcolepsy, and type 2 diabetes.   OT comments  Treatment focused on getting patient out of bed and mobilizing to maintain strength and endurance. Patient declined ADL tasks. Did demonstrate that he had difficulty donning RLE sock due to stiffness and pain. Patient ambulated ~ 200 feet in hallway with RW and reporting dizziness and needing one standing rest break. When patient returned to room monitored BP 124/79 in sitting, 136/81 in standing and 123/81 after 3 minutes. With further questioning patient reporting dizziness increases with head movement. Patient encouraged to get out of bed more often and at least for meals to promote activity and strength. Patient verbalized understanding of need to mobilize. Cont POC.   Follow Up Recommendations  No OT follow up;Supervision - Intermittent    Equipment Recommendations  3 in 1 bedside commode    Recommendations for Other Services      Precautions / Restrictions Precautions Precautions: Fall Precaution Comments: complaints of dizziness with ambulation, head movement Restrictions Weight Bearing Restrictions: No       Mobility Bed Mobility Overal bed mobility: Modified Independent                Transfers Overall transfer level: Needs assistance Equipment used: Rolling walker (2 wheeled) Transfers: Sit to/from Stand Sit to Stand: Supervision         General transfer comment: Min guard to ambulate approx 200 feet in hallway with RW. No loss of balance but reports of RLE pain and dizziness.    Balance Overall balance assessment: Mild deficits observed, not formally tested                                          ADL either performed or assessed with clinical judgement   ADL Overall ADL's : Needs assistance/impaired                     Lower Body Dressing: Minimal assistance Lower Body Dressing Details (indicate cue type and reason): Demonstrates ability to use figure four position to don left sock, but difficulty with right sock due to stiffness/pain in RLE.                     Vision   Vision Assessment?: No apparent visual deficits   Perception     Praxis      Cognition Arousal/Alertness: Awake/alert Behavior During Therapy: Flat affect Overall Cognitive Status: Within Functional Limits for tasks assessed                                          Exercises     Shoulder Instructions       General Comments      Pertinent Vitals/ Pain       Pain Assessment: 0-10 Pain Score: 6  Pain Location: R LE Pain Descriptors / Indicators: Grimacing;Aching;Sharp Pain Intervention(s): Monitored during session;Limited activity within patient's tolerance  Home Living  Prior Functioning/Environment              Frequency  Min 2X/week        Progress Toward Goals  OT Goals(current goals can now be found in the care plan section)  Progress towards OT goals: Progressing toward goals  Acute Rehab OT Goals Patient Stated Goal: feel better OT Goal Formulation: With patient Time For Goal Achievement: 11/04/20 Potential to Achieve Goals: Good  Plan Discharge plan remains appropriate    Co-evaluation                 AM-PAC OT "6 Clicks" Daily Activity     Outcome Measure     Help from another person taking care of personal grooming?: None Help from another person toileting, which includes using toliet, bedpan, or urinal?: A Little Help from another person bathing (including washing, rinsing, drying)?: A Little Help from another person to put on and taking off  regular upper body clothing?: A Little Help from another person to put on and taking off regular lower body clothing?: A Little 6 Click Score: 16    End of Session Equipment Utilized During Treatment: Rolling walker  OT Visit Diagnosis: Unsteadiness on feet (R26.81);Other abnormalities of gait and mobility (R26.89);Muscle weakness (generalized) (M62.81);Pain Pain - Right/Left: Right Pain - part of body: Leg   Activity Tolerance Patient tolerated treatment well   Patient Left in bed;with call bell/phone within reach   Nurse Communication  (okay to see per RN)        Time: 1308-6578 OT Time Calculation (min): 17 min  Charges: OT General Charges $OT Visit: 1 Visit OT Treatments $Therapeutic Activity: 8-22 mins  Waldron Session, OTR/L Acute Care Rehab Services  Office [phone] Pager: [phone]    Kelli Churn 10/23/2020, 2:44 PM

## 2020-10-24 LAB — CBC WITH DIFFERENTIAL/PLATELET
Abs Immature Granulocytes: 0.4 10*3/uL — ABNORMAL HIGH (ref 0.00–0.07)
Basophils Absolute: 0 10*3/uL (ref 0.0–0.1)
Basophils Relative: 0 %
Eosinophils Absolute: 0 10*3/uL (ref 0.0–0.5)
Eosinophils Relative: 0 %
HCT: 29.1 % — ABNORMAL LOW (ref 39.0–52.0)
Hemoglobin: 9.9 g/dL — ABNORMAL LOW (ref 13.0–17.0)
Immature Granulocytes: 3 %
Lymphocytes Relative: 13 %
Lymphs Abs: 2 10*3/uL (ref 0.7–4.0)
MCH: 29.2 pg (ref 26.0–34.0)
MCHC: 34 g/dL (ref 30.0–36.0)
MCV: 85.8 fL (ref 80.0–100.0)
Monocytes Absolute: 1.3 10*3/uL — ABNORMAL HIGH (ref 0.1–1.0)
Monocytes Relative: 8 %
Neutro Abs: 11.8 10*3/uL — ABNORMAL HIGH (ref 1.7–7.7)
Neutrophils Relative %: 76 %
Platelets: 212 10*3/uL (ref 150–400)
RBC: 3.39 MIL/uL — ABNORMAL LOW (ref 4.22–5.81)
RDW: 14.3 % (ref 11.5–15.5)
WBC: 15.5 10*3/uL — ABNORMAL HIGH (ref 4.0–10.5)
nRBC: 0 % (ref 0.0–0.2)

## 2020-10-24 LAB — GLUCOSE, CAPILLARY
Glucose-Capillary: 83 mg/dL (ref 70–99)
Glucose-Capillary: 93 mg/dL (ref 70–99)
Glucose-Capillary: 82 mg/dL (ref 70–99)
Glucose-Capillary: 83 mg/dL (ref 70–99)

## 2020-10-24 LAB — RENAL FUNCTION PANEL
Albumin: 2.3 g/dL — ABNORMAL LOW (ref 3.5–5.0)
Anion gap: 14 (ref 5–15)
BUN: 105 mg/dL — ABNORMAL HIGH (ref 6–20)
CO2: 26 mmol/L (ref 22–32)
Calcium: 8.2 mg/dL — ABNORMAL LOW (ref 8.9–10.3)
Chloride: 98 mmol/L (ref 98–111)
Creatinine, Ser: 8.02 mg/dL — ABNORMAL HIGH (ref 0.61–1.24)
GFR, Estimated: 8 mL/min — ABNORMAL LOW (ref >60)
Glucose, Bld: 117 mg/dL — ABNORMAL HIGH (ref 70–99)
Phosphorus: 6.5 mg/dL — ABNORMAL HIGH (ref 2.5–4.6)
Potassium: 3.5 mmol/L (ref 3.5–5.1)
Sodium: 138 mmol/L (ref 135–145)

## 2020-10-24 MED ORDER — SODIUM CHLORIDE 0.9% FLUSH: 10.0000 mL | INTRAVENOUS | Status: DC | PRN

## 2020-10-24 MED ORDER — SODIUM CHLORIDE 0.9 % IV SOLN: INTRAVENOUS | Status: DC

## 2020-10-24 NOTE — Progress Notes (Signed)
Patient ID: Colbie Danner, male   DOB: 1986/01/02, 34 y.o.   MRN: [id number] S: Complaining of right leg and thigh pain, as well as scrotal pain. O:BP 132/73 (BP Location: Left Arm)   Pulse (!) 59   Temp 99.4 F (37.4 C) (Oral)   Resp 18   Ht 5\' 8"  (1.727 m)   Wt 81.6 kg   SpO2 98%   BMI 27.34 kg/m   Intake/Output Summary (Last 24 hours) at 10/24/2020 1503 Last data filed at 10/24/2020 1220 Gross per 24 hour  Intake 960 ml  Output 1950 ml  Net -990 ml   Intake/Output: I/O last 3 completed shifts: In: 1180 [P.O.:1080; IV Piggyback:100] Out: 2600 [Urine:2600]  Intake/Output this shift:  Total I/O In: 360 [P.O.:360] Out: 1000 [Urine:1000] Weight change: 0 kg Gen: NAD CVS: RRR Resp: cta Abd: +BS, soft, NT/ND Ext: no edema  Recent Labs  Lab 10/18/20 0532 10/19/20 0222 10/20/20 0348 10/21/20 0150 10/22/20 0455 10/23/20 0209 10/24/20 0421  NA 131* 132* 134* 134* 136 136 138  K 4.8 4.5 3.6 3.7 3.7 3.5 3.5  CL 102 103 101 98 96* 98 98  CO2 16* 17* 18* 23 25 24 26   GLUCOSE 91 115* 112* 168* 90 136* 117*  BUN 99* 109* 110* 119* 124* 119* 105*  CREATININE 9.16* 10.62* 11.18* 11.34* 11.21* 10.20* 8.02*  ALBUMIN 2.6* 2.3* 2.2* 2.2* 2.4* 2.3* 2.3*  CALCIUM 7.5* 7.7* 7.9* 7.9* 8.3* 8.2* 8.2*  PHOS 7.6* 7.4* 7.7* 7.7* 7.2* 7.1* 6.5*  AST 107* 68* 41 38  --   --   --   ALT 112* 89* 66* 63*  --   --   --    Liver Function Tests: Recent Labs  Lab 10/19/20 0222 10/20/20 0348 10/21/20 0150 10/22/20 0455 10/23/20 0209 10/24/20 0421  AST 68* 41 38  --   --   --   ALT 89* 66* 63*  --   --   --   ALKPHOS 44 39 37*  --   --   --   BILITOT 0.8 0.6 0.7  --   --   --   PROT 4.7* 4.3* 4.3*  --   --   --   ALBUMIN 2.3* 2.2* 2.2* 2.4* 2.3* 2.3*   No results for input(s): LIPASE, AMYLASE in the last 168 hours. No results for input(s): AMMONIA in the last 168 hours. CBC: Recent Labs  Lab 10/20/20 0348 10/21/20 0150 10/22/20 0455 10/23/20 0209 10/24/20 0421  WBC 12.3*  13.3* 15.7* 14.3* 15.5*  NEUTROABS 9.2* 10.5* 11.2* 10.2* 11.8*  HGB 10.5* 10.7* 10.9* 10.3* 9.9*  HCT 28.5* 30.0* 31.2* 28.3* 29.1*  MCV 81.4 81.3 83.0 82.7 85.8  PLT 214 232 231 212 212   Cardiac Enzymes: Recent Labs  Lab 10/18/20 0532 10/19/20 0222 10/20/20 0348 10/21/20 0150  CKTOTAL 9,665* 5,481* 3,330* 2,647*   CBG: Recent Labs  Lab 10/23/20 1204 10/23/20 1631 10/23/20 2138 10/24/20 0655 10/24/20 1112  GLUCAP 126* 161* 164* 83 93    Iron Studies: No results for input(s): IRON, TIBC, TRANSFERRIN, FERRITIN in the last 72 hours. Studies/Results: No results found. . Chlorhexidine Gluconate Cloth  6 each Topical Daily  . doxycycline  100 mg Oral Q12H  . fluticasone  1 spray Each Nare Daily  . heparin  5,000 Units Subcutaneous Q8H  . insulin aspart  0-9 Units Subcutaneous TID AC  . vitamin B-12  100 mcg Oral Daily    BMET    Component Value Date/Time  NA 138 10/24/2020 0421   K 3.5 10/24/2020 0421   CL 98 10/24/2020 0421   CO2 26 10/24/2020 0421   GLUCOSE 117 (H) 10/24/2020 0421   BUN 105 (H) 10/24/2020 0421   CREATININE 8.02 (H) 10/24/2020 0421   CALCIUM 8.2 (L) 10/24/2020 0421   GFRNONAA 8 (L) 10/24/2020 0421   GFRAA >60 02/07/2020 1448   CBC    Component Value Date/Time   WBC 15.5 (H) 10/24/2020 0421   RBC 3.39 (L) 10/24/2020 0421   HGB 9.9 (L) 10/24/2020 0421   HCT 29.1 (L) 10/24/2020 0421   PLT 212 10/24/2020 0421   MCV 85.8 10/24/2020 0421   MCH 29.2 10/24/2020 0421   MCHC 34.0 10/24/2020 0421   RDW 14.3 10/24/2020 0421   LYMPHSABS 2.0 10/24/2020 0421   MONOABS 1.3 (H) 10/24/2020 0421   EOSABS 0.0 10/24/2020 0421   BASOSABS 0.0 10/24/2020 0421     Assessment/Plan:  1. AKI, non-oliguric- due to acute rhabdomyolysis.  CK >50,000 and renal US without obstruction.  Improving slowly with IVF's.  Has RIJ temp HD cath placed but has not required dialysis.  Resume IVF's and follow.  No indication for dialysis and if Scr continues to improve with  remove temp HD catheter. 2. Acute rhabdomyolysis- improving with IVF's.  Possibly due to covid vaccination. 3. Volume overload with scrotal edema, improving with increased UOP 4. Hyperkalemia- resolved.  Irena Cords, MD [employer] [phone]

## 2020-10-24 NOTE — Progress Notes (Signed)
PROGRESS NOTE    William Quinn  WPV:[id number] DOB: 09/22/1986 DOA: 10/15/2020 PCP: Iven Finn., MD   Brief Narrative: 34 year old with past medical history significant for avascular necrosis, narcolepsy, type 2 diabetes who presented to the ED for evaluation of numbness of his right leg.  Patient reported his symptoms started the day prior to admission.  He report having right lower extremity pain numbness and tingling.  Patient numbness is in a stocking type pattern.  Patient was found to have acute kidney injury with elevated BUN and creatinine. He was found to have also rhabdomyolysis with CK more than 50,000.  Assessment & Plan:   Active Problems:   AKI (acute kidney injury) (HCC)   Lower extremity weakness   Hyponatremia   Narcolepsy   Avascular necrosis (HCC)   Type 2 diabetes mellitus without complication (HCC)   Rhabdomyolysis   Weakness of left lower extremity  1-AKI probably related to Rhabdomyolysis: Metabolic acidosis  Nephrology is following. Renal failure secondary to rhabdomyolysis. Nephrology has concern that covid vaccine might have cause rhabdo.  Had HD catheter placement on 12/16. No clear source for Rhabdo. He denies taking dextromethorphan recently.  Renal function continues to improve, creatinine down to 8.  1.8 L of urine output yesterday. He report the scrotal swelling, mild discomfort.  Will defer she relates this to her nephrologist  Acute right lower extremity weakness numbness: CT lumbar spine no evidence of disc herniation. He was a started on Solu-Medrol.  This was discontinued. TSH 0.199, free T3 1.2 low   free T4 normal. Might be related to sick euthyroid. Needs follow up thyroid function test in 2 weeks.  B12 level low normal. Started  supplement.  Taper prednisone. Last dose today Doppler negative for DVT Numbness improved.   History of a vascular necrosis: Presumably he was treated with long-term steroid for presumed multiple  sclerosis.  In 2015 or 16 it was decided that he is did not have multiple sclerosis and oral steroids were discontinued.  Diabetes  type II: Continue with sliding scale insulin Continue to hold Metformin.  Hyponatremia: Resolved.   Questionable gout: Uric acid at 12: On prednisone daily now, taper prednisone.   Hyperkalemia: Received Lokelma Resolved.   Dyspnea; pleural effusion, atelectasis vs PNA.  Afebrile.  Incentive spirometry/  Will treated for PNA for 5 days. Day 4. WBC fluctuates.   Transaminases; likely related to Rhabdomyolysis. Trending down.   Estimated body mass index is 27.34 kg/m as calculated from the following:   Height as of this encounter: 5\' 8"  (1.727 m).   Weight as of this encounter: 81.6 kg.   DVT prophylaxis: Heparin Code Status: Full code Family Communication: Care discussed with patient Disposition Plan:  Status is: Inpatient  Remains inpatient appropriate because:IV treatments appropriate due to intensity of illness or inability to take PO   Dispo: The patient is from: Home              Anticipated d/c is to: Home              Anticipated d/c date is: 2 days              Patient currently is not medically stable to d/c.        Consultants:   Nephrology  Procedures:   None  Antimicrobials:    Subjective: He is feeling ok, report scrotal edema. Mild discomfort.   Objective: Vitals:   10/23/20 1635 10/23/20 2140 10/24/20 0500 10/24/20 0939  BP: 116/67  122/72  132/73  Pulse: (!) 52 (!) 54  (!) 59  Resp: 18 16  18   Temp: 98.2 F (36.8 C) 99.6 F (37.6 C)  99.4 F (37.4 C)  TempSrc: Oral Oral  Oral  SpO2: 97% 95%  98%  Weight:  81.6 kg 81.6 kg   Height:        Intake/Output Summary (Last 24 hours) at 10/24/2020 1358 Last data filed at 10/24/2020 1220 Gross per 24 hour  Intake 960 ml  Output 1950 ml  Net -990 ml   Filed Weights   10/22/20 2135 10/23/20 2140 10/24/20 0500  Weight: 81.6 kg 81.6 kg 81.6 kg     Examination:  General exam: NAD Respiratory system: CTA Cardiovascular system: S 1, S 2 RRR Gastrointestinal system: BS present, soft, nt Central nervous system: Alert, follows command Extremities: No edema    Data Reviewed: I have personally reviewed following labs and imaging studies  CBC: Recent Labs  Lab 10/20/20 0348 10/21/20 0150 10/22/20 0455 10/23/20 0209 10/24/20 0421  WBC 12.3* 13.3* 15.7* 14.3* 15.5*  NEUTROABS 9.2* 10.5* 11.2* 10.2* 11.8*  HGB 10.5* 10.7* 10.9* 10.3* 9.9*  HCT 28.5* 30.0* 31.2* 28.3* 29.1*  MCV 81.4 81.3 83.0 82.7 85.8  PLT 214 232 231 212 212   Basic Metabolic Panel: Recent Labs  Lab 10/18/20 0532 10/19/20 0222 10/20/20 0348 10/21/20 0150 10/22/20 0455 10/23/20 0209 10/24/20 0421  NA 131* 132* 134* 134* 136 136 138  K 4.8 4.5 3.6 3.7 3.7 3.5 3.5  CL 102 103 101 98 96* 98 98  CO2 16* 17* 18* 23 25 24 26   GLUCOSE 91 115* 112* 168* 90 136* 117*  BUN 99* 109* 110* 119* 124* 119* 105*  CREATININE 9.16* 10.62* 11.18* 11.34* 11.21* 10.20* 8.02*  CALCIUM 7.5* 7.7* 7.9* 7.9* 8.3* 8.2* 8.2*  MG 2.5* 2.3 2.4 2.3  --   --   --   PHOS 7.6* 7.4* 7.7* 7.7* 7.2* 7.1* 6.5*   GFR: Estimated Creatinine Clearance: 12.3 mL/min (A) (by C-G formula based on SCr of 8.02 mg/dL (H)). Liver Function Tests: Recent Labs  Lab 10/18/20 0532 10/19/20 0222 10/20/20 0348 10/21/20 0150 10/22/20 0455 10/23/20 0209 10/24/20 0421  AST 107* 68* 41 38  --   --   --   ALT 112* 89* 66* 63*  --   --   --   ALKPHOS 43 44 39 37*  --   --   --   BILITOT 0.5 0.8 0.6 0.7  --   --   --   PROT 5.1* 4.7* 4.3* 4.3*  --   --   --   ALBUMIN 2.6* 2.3* 2.2* 2.2* 2.4* 2.3* 2.3*   No results for input(s): LIPASE, AMYLASE in the last 168 hours. No results for input(s): AMMONIA in the last 168 hours. Coagulation Profile: No results for input(s): INR, PROTIME in the last 168 hours. Cardiac Enzymes: Recent Labs  Lab 10/18/20 0532 10/19/20 0222 10/20/20 0348  10/21/20 0150  CKTOTAL 9,665* 5,481* 3,330* 2,647*   BNP (last 3 results) No results for input(s): PROBNP in the last 8760 hours. HbA1C: No results for input(s): HGBA1C in the last 72 hours. CBG: Recent Labs  Lab 10/23/20 1204 10/23/20 1631 10/23/20 2138 10/24/20 0655 10/24/20 1112  GLUCAP 126* 161* 164* 83 93   Lipid Profile: No results for input(s): CHOL, HDL, LDLCALC, TRIG, CHOLHDL, LDLDIRECT in the last 72 hours. Thyroid Function Tests: No results for input(s): TSH, T4TOTAL, FREET4, T3FREE, THYROIDAB in the  last 72 hours. Anemia Panel: No results for input(s): VITAMINB12, FOLATE, FERRITIN, TIBC, IRON, RETICCTPCT in the last 72 hours. Sepsis Labs: No results for input(s): PROCALCITON, LATICACIDVEN in the last 168 hours.  Recent Results (from the past 240 hour(s))  Resp Panel by RT-PCR (Flu A&B, Covid) Nasopharyngeal Swab     Status: None   Collection Time: 10/15/20  1:57 PM   Specimen: Nasopharyngeal Swab; Nasopharyngeal(NP) swabs in vial transport medium  Result Value Ref Range Status   SARS Coronavirus 2 by RT PCR NEGATIVE NEGATIVE Final    Comment: (NOTE) SARS-CoV-2 target nucleic acids are NOT DETECTED.  The SARS-CoV-2 RNA is generally detectable in upper respiratory specimens during the acute phase of infection. The lowest concentration of SARS-CoV-2 viral copies this assay can detect is 138 copies/mL. A negative result does not preclude SARS-Cov-2 infection and should not be used as the sole basis for treatment or other patient management decisions. A negative result may occur with  improper specimen collection/handling, submission of specimen other than nasopharyngeal swab, presence of viral mutation(s) within the areas targeted by this assay, and inadequate number of viral copies(<138 copies/mL). A negative result must be combined with clinical observations, patient history, and epidemiological information. The expected result is Negative.  Fact Sheet for  Patients:  BloggerCourse.com  Fact Sheet for Healthcare Providers:  SeriousBroker.it  This test is no t yet approved or cleared by the [place] FDA and  has been authorized for detection and/or diagnosis of SARS-CoV-2 by FDA under an Emergency Use Authorization (EUA). This EUA will remain  in effect (meaning this test can be used) for the duration of the COVID-19 declaration under Section 564(b)(1) of the Act, 21 U.S.C.section 360bbb-3(b)(1), unless the authorization is terminated  or revoked sooner.       Influenza A by PCR NEGATIVE NEGATIVE Final   Influenza B by PCR NEGATIVE NEGATIVE Final    Comment: (NOTE) The Xpert Xpress SARS-CoV-2/FLU/RSV plus assay is intended as an aid in the diagnosis of influenza from Nasopharyngeal swab specimens and should not be used as a sole basis for treatment. Nasal washings and aspirates are unacceptable for Xpert Xpress SARS-CoV-2/FLU/RSV testing.  Fact Sheet for Patients: BloggerCourse.com  Fact Sheet for Healthcare Providers: SeriousBroker.it  This test is not yet approved or cleared by the [place] FDA and has been authorized for detection and/or diagnosis of SARS-CoV-2 by FDA under an Emergency Use Authorization (EUA). This EUA will remain in effect (meaning this test can be used) for the duration of the COVID-19 declaration under Section 564(b)(1) of the Act, 21 U.S.C. section 360bbb-3(b)(1), unless the authorization is terminated or revoked.  Performed at [facility], [address] [place], [place] [address]          Radiology Studies: No results found.      Scheduled Meds: . Chlorhexidine Gluconate Cloth  6 each Topical Daily  . doxycycline  100 mg Oral Q12H  . fluticasone  1 spray Each Nare Daily  . heparin  5,000 Units Subcutaneous Q8H  . insulin aspart  0-9 Units Subcutaneous TID AC  .  vitamin B-12  100 mcg Oral Daily   Continuous Infusions: . cefTRIAXone (ROCEPHIN)  IV 2 g (10/24/20 1109)     LOS: 9 days    Time spent: 35 minutes.     Alba Cory, MD [facility]   If 7PM-7AM, please contact night-coverage [email or web address]  10/24/2020, 1:58 PM

## 2020-10-25 LAB — RENAL FUNCTION PANEL
Albumin: 2.3 g/dL — ABNORMAL LOW (ref 3.5–5.0)
Anion gap: 10 (ref 5–15)
BUN: 88 mg/dL — ABNORMAL HIGH (ref 6–20)
CO2: 27 mmol/L (ref 22–32)
Calcium: 8.4 mg/dL — ABNORMAL LOW (ref 8.9–10.3)
Chloride: 104 mmol/L (ref 98–111)
Creatinine, Ser: 6.38 mg/dL — ABNORMAL HIGH (ref 0.61–1.24)
GFR, Estimated: 11 mL/min — ABNORMAL LOW (ref >60)
Glucose, Bld: 84 mg/dL (ref 70–99)
Phosphorus: 4.4 mg/dL (ref 2.5–4.6)
Potassium: 4 mmol/L (ref 3.5–5.1)
Sodium: 141 mmol/L (ref 135–145)

## 2020-10-25 LAB — GLUCOSE, CAPILLARY
Glucose-Capillary: 76 mg/dL (ref 70–99)
Glucose-Capillary: 70 mg/dL (ref 70–99)
Glucose-Capillary: 78 mg/dL (ref 70–99)
Glucose-Capillary: 81 mg/dL (ref 70–99)

## 2020-10-25 MED ORDER — PANTOPRAZOLE SODIUM 40 MG PO TBEC
40.0000 mg | DELAYED_RELEASE_TABLET | Freq: Every day | ORAL | Status: DC
  Administered 2020-10-25 – 2020-10-31 (×7): 40 mg via ORAL
  Filled 2020-10-25 (×7): qty 1

## 2020-10-25 MED ORDER — SORBITOL 70 % SOLN
30.0000 mL | Freq: Once | Status: AC
  Administered 2020-10-25: 30 mL via ORAL
  Filled 2020-10-25: qty 30

## 2020-10-25 NOTE — Plan of Care (Signed)
  Problem: Nutritional: Goal: Ability to make appropriate dietary choices will improve Outcome: Completed/Met   

## 2020-10-25 NOTE — Progress Notes (Signed)
Physical Therapy Treatment Patient Details Name: William Quinn MRN: [id number] DOB: [birth year] Today's Date: 10/25/2020    History of Present Illness 34 yo male presenting to ED with numbness and pain of RLE. found to have acute kidney injury with elevated BUN and creatinine. PMH including avascular necrosis, narcolepsy, and type 2 diabetes.    PT Comments    Pt doing well with mobility. Continues to use rolling walker due to RLE pain. May be able to progress away from walker prior to dc.    Follow Up Recommendations  No PT follow up     Equipment Recommendations  Rolling walker with 5" wheels (Hopefully will progress to nothing)    Recommendations for Other Services       Precautions / Restrictions Precautions Precautions: None    Mobility  Bed Mobility Overal bed mobility: Modified Independent             General bed mobility comments: Increased time  Transfers Overall transfer level: Modified independent Equipment used: Rolling walker (2 wheeled) Transfers: Sit to/from Stand Sit to Stand: Modified independent (Device/Increase time)            Ambulation/Gait Ambulation/Gait assistance: Supervision Gait Distance (Feet): 400 Feet Assistive device: Rolling walker (2 wheeled) Gait Pattern/deviations: Wide base of support;Step-through pattern;Decreased step length - right;Decreased step length - left;Decreased stride length Gait velocity: decr Gait velocity interpretation: >2.62 ft/sec, indicative of community ambulatory General Gait Details: Steady with walker. Assist for IV. Didn't want to attempt without walker due to painful RLE   Stairs             Wheelchair Mobility    Modified Rankin (Stroke Patients Only)       Balance Overall balance assessment: No apparent balance deficits (not formally assessed)                                          Cognition Arousal/Alertness: Awake/alert Behavior During Therapy: WFL for  tasks assessed/performed Overall Cognitive Status: Within Functional Limits for tasks assessed                                        Exercises      General Comments        Pertinent Vitals/Pain Pain Assessment: 0-10 Pain Score: 6  Pain Location: R LE Pain Descriptors / Indicators: Grimacing;Aching;Sharp    Home Living                      Prior Function            PT Goals (current goals can now be found in the care plan section) Acute Rehab PT Goals Patient Stated Goal: feel better Progress towards PT goals: Progressing toward goals    Frequency    Min 3X/week      PT Plan Current plan remains appropriate    Co-evaluation              AM-PAC PT "6 Clicks" Mobility   Outcome Measure  Help needed turning from your back to your side while in a flat bed without using bedrails?: None Help needed moving from lying on your back to sitting on the side of a flat bed without using bedrails?: None Help needed moving to and from a bed to  a chair (including a wheelchair)?: None Help needed standing up from a chair using your arms (e.g., wheelchair or bedside chair)?: None Help needed to walk in hospital room?: None Help needed climbing 3-5 steps with a railing? : A Little 6 Click Score: 23    End of Session   Activity Tolerance: Patient tolerated treatment well Patient left: with call bell/phone within reach;in chair Nurse Communication: Mobility status PT Visit Diagnosis: Muscle weakness (generalized) (M62.81);Difficulty in walking, not elsewhere classified (R26.2);Pain Pain - Right/Left: Right Pain - part of body: Leg     Time: 9892-1194 PT Time Calculation (min) (ACUTE ONLY): 14 min  Charges:  $Gait Training: 8-22 mins                     [facility] PT Acute Rehabilitation Services Pager [phone] Office [phone]    Angelina Ok [facility] 10/25/2020, 11:47 AM

## 2020-10-25 NOTE — Progress Notes (Signed)
DBIV: Arrived to pt's room, found that labs were drawn peripherally. Education completed with pt re: central line. Site unremarkable.

## 2020-10-25 NOTE — Plan of Care (Signed)
  Problem: Education: Goal: Knowledge of General Education information will improve Description: Including pain rating scale, medication(s)/side effects and non-pharmacologic comfort measures Outcome: Completed/Met   Problem: Clinical Measurements: Goal: Will remain free from infection Outcome: Completed/Met Goal: Diagnostic test results will improve Outcome: Completed/Met Goal: Respiratory complications will improve Outcome: Completed/Met Goal: Cardiovascular complication will be avoided Outcome: Completed/Met   Problem: Nutrition: Goal: Adequate nutrition will be maintained Outcome: Completed/Met   Problem: Coping: Goal: Level of anxiety will decrease Outcome: Completed/Met   Problem: Elimination: Goal: Will not experience complications related to bowel motility Outcome: Completed/Met Goal: Will not experience complications related to urinary retention Outcome: Completed/Met   Problem: Skin Integrity: Goal: Risk for impaired skin integrity will decrease Outcome: Completed/Met   Problem: Clinical Measurements: Goal: Complications related to the disease process or treatment will be avoided or minimized Outcome: Completed/Met   Problem: Activity: Goal: Activity intolerance will improve Outcome: Completed/Met   Problem: Fluid Volume: Goal: Fluid volume balance will be maintained or improved Outcome: Completed/Met   Problem: Respiratory: Goal: Respiratory symptoms related to disease process will be avoided Outcome: Completed/Met   Problem: Self-Concept: Goal: Body image disturbance will be avoided or minimized Outcome: Completed/Met   Problem: Urinary Elimination: Goal: Progression of disease will be identified and treated Outcome: Completed/Met   Problem: Education: Goal: Knowledge of disease and its progression will improve Outcome: Completed/Met   Problem: Fluid Volume: Goal: Fluid volume balance will be maintained or improved Outcome: Completed/Met    Problem: Nutritional: Goal: Ability to make appropriate dietary choices will improve Outcome: Completed/Met   Problem: Respiratory: Goal: Respiratory symptoms related to disease process will be avoided Outcome: Completed/Met   Problem: Self-Concept: Goal: Body image disturbance will be avoided or minimized Outcome: Completed/Met   Problem: Urinary Elimination: Goal: Progression of disease will be identified and treated Outcome: Completed/Met   Problem: Education: Goal: Knowledge of disease and its progression will improve Outcome: Completed/Met   Problem: Activity: Goal: Activity intolerance will improve Outcome: Completed/Met   Problem: Fluid Volume: Goal: Fluid volume balance will be maintained or improved Outcome: Completed/Met   Problem: Nutritional: Goal: Ability to make appropriate dietary choices will improve Outcome: Completed/Met   Problem: Respiratory: Goal: Respiratory symptoms related to disease process will be avoided Outcome: Completed/Met   Problem: Self-Concept: Goal: Body image disturbance will be avoided or minimized Outcome: Completed/Met   Problem: Urinary Elimination: Goal: Progression of disease will be identified and treated Outcome: Completed/Met

## 2020-10-25 NOTE — Progress Notes (Signed)
PROGRESS NOTE    William Quinn  NWG:[id number] DOB: [birth year] DOA: 10/15/2020 PCP: Iven Finn., MD   Brief Narrative: 34 year old with past medical history significant for avascular necrosis, narcolepsy, type 2 diabetes who presented to the ED for evaluation of numbness of his right leg.  Patient reported his symptoms started the day prior to admission.  He report having right lower extremity pain numbness and tingling.  Patient numbness is in a stocking type pattern.  Patient was found to have acute kidney injury with elevated BUN and creatinine. He was found to have also rhabdomyolysis with CK more than 50,000.  Assessment & Plan:   Active Problems:   AKI (acute kidney injury) (HCC)   Lower extremity weakness   Hyponatremia   Narcolepsy   Avascular necrosis (HCC)   Type 2 diabetes mellitus without complication (HCC)   Rhabdomyolysis   Weakness of left lower extremity  1-AKI probably related to Rhabdomyolysis: Metabolic acidosis  Nephrology is following. Renal failure secondary to rhabdomyolysis. Nephrology has concern that covid vaccine might have cause rhabdo.  Had HD catheter placement on 12/16. No clear source for Rhabdo. He denies taking dextromethorphan recently.  Renal function continues to improve, creatinine down to 6. 4.2  L of urine output yesterday. He report the scrotal swelling, mild discomfort.  Scrotal support ordered.  Improving, he has not required HD.   Acute right lower extremity weakness numbness: CT lumbar spine no evidence of disc herniation. He was a started on Solu-Medrol.  This was discontinued. TSH 0.199, free T3 1.2 low   free T4 normal. Might be related to sick euthyroid. Needs follow up thyroid function test in 2 weeks.  B12 level low normal. Started  supplement.  Taper prednisone. Last dose 12/20. Doppler negative for DVT. Numbness improved.   History of a vascular necrosis: Presumably he was treated with long-term steroid for presumed  multiple sclerosis.  In 2015 or 16 it was decided that he is did not have multiple sclerosis and oral steroids were discontinued.  Diabetes  type II: Continue with sliding scale insulin Continue to hold Metformin.  Hyponatremia: Resolved.   Questionable gout: Uric acid at 12: On prednisone daily now, taper prednisone.   Hyperkalemia: Received Lokelma Resolved.   Dyspnea; pleural effusion, atelectasis vs PNA.  Afebrile.  Incentive spirometry/  Will treated for PNA for 5 days. Day 5/5. WBC fluctuates.   Transaminases; likely related to Rhabdomyolysis. Trending down.   Abdominal pain, constipation;  Sorbitol ordered.  Proronix ordered.   Estimated body mass index is 26.35 kg/m as calculated from the following:   Height as of this encounter: 5\' 8"  (1.727 m).   Weight as of this encounter: 78.6 kg.   DVT prophylaxis: Heparin Code Status: Full code Family Communication: Care discussed with patient Disposition Plan:  Status is: Inpatient  Remains inpatient appropriate because:IV treatments appropriate due to intensity of illness or inability to take PO   Dispo: The patient is from: Home              Anticipated d/c is to: Home              Anticipated d/c date is: 2 days              Patient currently is not medically stable to d/c. Discharge when ok by nephrology, awaiting renal function recovery         Consultants:   Nephrology  Procedures:   None  Antimicrobials:    Subjective: Report abdominal  cramping pain. Only had small bowel movement.   Objective: Vitals:   10/24/20 1637 10/24/20 2038 10/25/20 0541 10/25/20 0929  BP: 116/70 115/75 126/75 125/67  Pulse: (!) 58 (!) 59 (!) 58 63  Resp: 18 16 18 18   Temp: 98.9 F (37.2 C) 98.9 F (37.2 C) 98.9 F (37.2 C) 98.8 F (37.1 C)  TempSrc: Oral Oral Oral Oral  SpO2: 99% 95% 96% 96%  Weight:  78.6 kg    Height:        Intake/Output Summary (Last 24 hours) at 10/25/2020 1334 Last data filed at  10/25/2020 1141 Gross per 24 hour  Intake 1733.93 ml  Output 3900 ml  Net -2166.07 ml   Filed Weights   10/23/20 2140 10/24/20 0500 10/24/20 2038  Weight: 81.6 kg 81.6 kg 78.6 kg    Examination:  General exam: NAD Respiratory system: CTA Cardiovascular system: S 1, S 2 RRR Gastrointestinal system: BS present, soft, nt Central nervous system: alert Extremities: No edema    Data Reviewed: I have personally reviewed following labs and imaging studies  CBC: Recent Labs  Lab 10/20/20 0348 10/21/20 0150 10/22/20 0455 10/23/20 0209 10/24/20 0421  WBC 12.3* 13.3* 15.7* 14.3* 15.5*  NEUTROABS 9.2* 10.5* 11.2* 10.2* 11.8*  HGB 10.5* 10.7* 10.9* 10.3* 9.9*  HCT 28.5* 30.0* 31.2* 28.3* 29.1*  MCV 81.4 81.3 83.0 82.7 85.8  PLT 214 232 231 212 212   Basic Metabolic Panel: Recent Labs  Lab 10/19/20 0222 10/20/20 0348 10/21/20 0150 10/22/20 0455 10/23/20 0209 10/24/20 0421 10/25/20 0335  NA 132* 134* 134* 136 136 138 141  K 4.5 3.6 3.7 3.7 3.5 3.5 4.0  CL 103 101 98 96* 98 98 104  CO2 17* 18* 23 25 24 26 27   GLUCOSE 115* 112* 168* 90 136* 117* 84  BUN 109* 110* 119* 124* 119* 105* 88*  CREATININE 10.62* 11.18* 11.34* 11.21* 10.20* 8.02* 6.38*  CALCIUM 7.7* 7.9* 7.9* 8.3* 8.2* 8.2* 8.4*  MG 2.3 2.4 2.3  --   --   --   --   PHOS 7.4* 7.7* 7.7* 7.2* 7.1* 6.5* 4.4   GFR: Estimated Creatinine Clearance: 15.5 mL/min (A) (by C-G formula based on SCr of 6.38 mg/dL (H)). Liver Function Tests: Recent Labs  Lab 10/19/20 0222 10/20/20 0348 10/21/20 0150 10/22/20 0455 10/23/20 0209 10/24/20 0421 10/25/20 0335  AST 68* 41 38  --   --   --   --   ALT 89* 66* 63*  --   --   --   --   ALKPHOS 44 39 37*  --   --   --   --   BILITOT 0.8 0.6 0.7  --   --   --   --   PROT 4.7* 4.3* 4.3*  --   --   --   --   ALBUMIN 2.3* 2.2* 2.2* 2.4* 2.3* 2.3* 2.3*   No results for input(s): LIPASE, AMYLASE in the last 168 hours. No results for input(s): AMMONIA in the last 168  hours. Coagulation Profile: No results for input(s): INR, PROTIME in the last 168 hours. Cardiac Enzymes: Recent Labs  Lab 10/19/20 0222 10/20/20 0348 10/21/20 0150  CKTOTAL 5,481* 3,330* 2,647*   BNP (last 3 results) No results for input(s): PROBNP in the last 8760 hours. HbA1C: No results for input(s): HGBA1C in the last 72 hours. CBG: Recent Labs  Lab 10/24/20 1112 10/24/20 1637 10/24/20 2039 10/25/20 0641 10/25/20 1140  GLUCAP 93 82 83 76 70  Lipid Profile: No results for input(s): CHOL, HDL, LDLCALC, TRIG, CHOLHDL, LDLDIRECT in the last 72 hours. Thyroid Function Tests: No results for input(s): TSH, T4TOTAL, FREET4, T3FREE, THYROIDAB in the last 72 hours. Anemia Panel: No results for input(s): VITAMINB12, FOLATE, FERRITIN, TIBC, IRON, RETICCTPCT in the last 72 hours. Sepsis Labs: No results for input(s): PROCALCITON, LATICACIDVEN in the last 168 hours.  Recent Results (from the past 240 hour(s))  Resp Panel by RT-PCR (Flu A&B, Covid) Nasopharyngeal Swab     Status: None   Collection Time: 10/15/20  1:57 PM   Specimen: Nasopharyngeal Swab; Nasopharyngeal(NP) swabs in vial transport medium  Result Value Ref Range Status   SARS Coronavirus 2 by RT PCR NEGATIVE NEGATIVE Final    Comment: (NOTE) SARS-CoV-2 target nucleic acids are NOT DETECTED.  The SARS-CoV-2 RNA is generally detectable in upper respiratory specimens during the acute phase of infection. The lowest concentration of SARS-CoV-2 viral copies this assay can detect is 138 copies/mL. A negative result does not preclude SARS-Cov-2 infection and should not be used as the sole basis for treatment or other patient management decisions. A negative result may occur with  improper specimen collection/handling, submission of specimen other than nasopharyngeal swab, presence of viral mutation(s) within the areas targeted by this assay, and inadequate number of viral copies(<138 copies/mL). A negative result must  be combined with clinical observations, patient history, and epidemiological information. The expected result is Negative.  Fact Sheet for Patients:  BloggerCourse.com  Fact Sheet for Healthcare Providers:  SeriousBroker.it  This test is no t yet approved or cleared by the [place] FDA and  has been authorized for detection and/or diagnosis of SARS-CoV-2 by FDA under an Emergency Use Authorization (EUA). This EUA will remain  in effect (meaning this test can be used) for the duration of the COVID-19 declaration under Section 564(b)(1) of the Act, 21 U.S.C.section 360bbb-3(b)(1), unless the authorization is terminated  or revoked sooner.       Influenza A by PCR NEGATIVE NEGATIVE Final   Influenza B by PCR NEGATIVE NEGATIVE Final    Comment: (NOTE) The Xpert Xpress SARS-CoV-2/FLU/RSV plus assay is intended as an aid in the diagnosis of influenza from Nasopharyngeal swab specimens and should not be used as a sole basis for treatment. Nasal washings and aspirates are unacceptable for Xpert Xpress SARS-CoV-2/FLU/RSV testing.  Fact Sheet for Patients: BloggerCourse.com  Fact Sheet for Healthcare Providers: SeriousBroker.it  This test is not yet approved or cleared by the [place] FDA and has been authorized for detection and/or diagnosis of SARS-CoV-2 by FDA under an Emergency Use Authorization (EUA). This EUA will remain in effect (meaning this test can be used) for the duration of the COVID-19 declaration under Section 564(b)(1) of the Act, 21 U.S.C. section 360bbb-3(b)(1), unless the authorization is terminated or revoked.  Performed at [facility], [address] [place], [place] [address]          Radiology Studies: No results found.      Scheduled Meds: . Chlorhexidine Gluconate Cloth  6 each Topical Daily  . doxycycline  100 mg Oral  Q12H  . fluticasone  1 spray Each Nare Daily  . heparin  5,000 Units Subcutaneous Q8H  . insulin aspart  0-9 Units Subcutaneous TID AC  . vitamin B-12  100 mcg Oral Daily   Continuous Infusions: . sodium chloride 50 mL/hr at 10/25/20 1110     LOS: 10 days    Time spent: 35 minutes.  Alba CoryBelkys A Darlisa Spruiell, MD [facility]   If 7PM-7AM, please contact night-coverage [email or web address]  10/25/2020, 1:34 PM

## 2020-10-25 NOTE — Progress Notes (Signed)
Patient ID: William Quinn, male   DOB: 31-Oct-1986, 34 y.o.   MRN: [id number] S: Feels better today O:BP 125/67 (BP Location: Left Arm)   Pulse 63   Temp 98.8 F (37.1 C) (Oral)   Resp 18   Ht 5\' 8"  (1.727 m)   Wt 78.6 kg   SpO2 96%   BMI 26.35 kg/m   Intake/Output Summary (Last 24 hours) at 10/25/2020 1218 Last data filed at 10/25/2020 1141 Gross per 24 hour  Intake 1853.93 ml  Output 3900 ml  Net -2046.07 ml   Intake/Output: I/O last 3 completed shifts: In: 2200.6 [P.O.:1560; I.V.:540.6; IV Piggyback:100] Out: 5150 [Urine:5150]  Intake/Output this shift:  Total I/O In: 493.3 [P.O.:240; I.V.:253.3] Out: 700 [Urine:700] Weight change: -2.96 kg Gen: NAD CVS: RRR Resp: cta Abd: +BS, soft, NT/ND Ext: trace edema of RLE  Recent Labs  Lab 10/19/20 0222 10/20/20 0348 10/21/20 0150 10/22/20 0455 10/23/20 0209 10/24/20 0421 10/25/20 0335  NA 132* 134* 134* 136 136 138 141  K 4.5 3.6 3.7 3.7 3.5 3.5 4.0  CL 103 101 98 96* 98 98 104  CO2 17* 18* 23 25 24 26 27   GLUCOSE 115* 112* 168* 90 136* 117* 84  BUN 109* 110* 119* 124* 119* 105* 88*  CREATININE 10.62* 11.18* 11.34* 11.21* 10.20* 8.02* 6.38*  ALBUMIN 2.3* 2.2* 2.2* 2.4* 2.3* 2.3* 2.3*  CALCIUM 7.7* 7.9* 7.9* 8.3* 8.2* 8.2* 8.4*  PHOS 7.4* 7.7* 7.7* 7.2* 7.1* 6.5* 4.4  AST 68* 41 38  --   --   --   --   ALT 89* 66* 63*  --   --   --   --    Liver Function Tests: Recent Labs  Lab 10/19/20 0222 10/20/20 0348 10/21/20 0150 10/22/20 0455 10/23/20 0209 10/24/20 0421 10/25/20 0335  AST 68* 41 38  --   --   --   --   ALT 89* 66* 63*  --   --   --   --   ALKPHOS 44 39 37*  --   --   --   --   BILITOT 0.8 0.6 0.7  --   --   --   --   PROT 4.7* 4.3* 4.3*  --   --   --   --   ALBUMIN 2.3* 2.2* 2.2*   < > 2.3* 2.3* 2.3*   < > = values in this interval not displayed.   No results for input(s): LIPASE, AMYLASE in the last 168 hours. No results for input(s): AMMONIA in the last 168 hours. CBC: Recent Labs  Lab  10/20/20 0348 10/21/20 0150 10/22/20 0455 10/23/20 0209 10/24/20 0421  WBC 12.3* 13.3* 15.7* 14.3* 15.5*  NEUTROABS 9.2* 10.5* 11.2* 10.2* 11.8*  HGB 10.5* 10.7* 10.9* 10.3* 9.9*  HCT 28.5* 30.0* 31.2* 28.3* 29.1*  MCV 81.4 81.3 83.0 82.7 85.8  PLT 214 232 231 212 212   Cardiac Enzymes: Recent Labs  Lab 10/19/20 0222 10/20/20 0348 10/21/20 0150  CKTOTAL 5,481* 3,330* 2,647*   CBG: Recent Labs  Lab 10/24/20 1112 10/24/20 1637 10/24/20 2039 10/25/20 0641 10/25/20 1140  GLUCAP 93 82 83 76 70    Iron Studies: No results for input(s): IRON, TIBC, TRANSFERRIN, FERRITIN in the last 72 hours. Studies/Results: No results found. . Chlorhexidine Gluconate Cloth  6 each Topical Daily  . doxycycline  100 mg Oral Q12H  . fluticasone  1 spray Each Nare Daily  . heparin  5,000 Units Subcutaneous Q8H  .  insulin aspart  0-9 Units Subcutaneous TID AC  . vitamin B-12  100 mcg Oral Daily    BMET    Component Value Date/Time   NA 141 10/25/2020 0335   K 4.0 10/25/2020 0335   CL 104 10/25/2020 0335   CO2 27 10/25/2020 0335   GLUCOSE 84 10/25/2020 0335   BUN 88 (H) 10/25/2020 0335   CREATININE 6.38 (H) 10/25/2020 0335   CALCIUM 8.4 (L) 10/25/2020 0335   GFRNONAA 11 (L) 10/25/2020 0335   GFRAA >60 02/07/2020 1448   CBC    Component Value Date/Time   WBC 15.5 (H) 10/24/2020 0421   RBC 3.39 (L) 10/24/2020 0421   HGB 9.9 (L) 10/24/2020 0421   HCT 29.1 (L) 10/24/2020 0421   PLT 212 10/24/2020 0421   MCV 85.8 10/24/2020 0421   MCH 29.2 10/24/2020 0421   MCHC 34.0 10/24/2020 0421   RDW 14.3 10/24/2020 0421   LYMPHSABS 2.0 10/24/2020 0421   MONOABS 1.3 (H) 10/24/2020 0421   EOSABS 0.0 10/24/2020 0421   BASOSABS 0.0 10/24/2020 0421     Assessment/Plan:  1. AKI, non-oliguric- due to acute rhabdomyolysis.  CK >50,000 and renal US without obstruction.  Improving slowly with IVF's.  Has RIJ temp HD cath placed but has not required dialysis. 1. Resumed IVF's 10/24/20 with  marked improvement of UOP. 2. No indication for dialysis and if Scr continues to improve with remove temp HD catheter. 2. Acute rhabdomyolysis- improving with IVF's.  Possibly due to covid vaccination. 3. Volume overload with scrotal edema, improving with increased UOP 4. Hyperkalemia- resolved.   Irena Cords, MD [employer] [phone]

## 2020-10-26 LAB — RENAL FUNCTION PANEL
Albumin: 2.4 g/dL — ABNORMAL LOW (ref 3.5–5.0)
Anion gap: 9 (ref 5–15)
BUN: 71 mg/dL — ABNORMAL HIGH (ref 6–20)
CO2: 28 mmol/L (ref 22–32)
Calcium: 8.2 mg/dL — ABNORMAL LOW (ref 8.9–10.3)
Chloride: 104 mmol/L (ref 98–111)
Creatinine, Ser: 4.6 mg/dL — ABNORMAL HIGH (ref 0.61–1.24)
GFR, Estimated: 16 mL/min — ABNORMAL LOW (ref >60)
Glucose, Bld: 89 mg/dL (ref 70–99)
Phosphorus: 4.6 mg/dL (ref 2.5–4.6)
Potassium: 4.2 mmol/L (ref 3.5–5.1)
Sodium: 141 mmol/L (ref 135–145)

## 2020-10-26 LAB — GLUCOSE, CAPILLARY
Glucose-Capillary: 80 mg/dL (ref 70–99)
Glucose-Capillary: 84 mg/dL (ref 70–99)
Glucose-Capillary: 79 mg/dL (ref 70–99)
Glucose-Capillary: 139 mg/dL — ABNORMAL HIGH (ref 70–99)

## 2020-10-26 NOTE — Progress Notes (Signed)
Patient ID: William Quinn, male   DOB: April 21, 1986, 34 y.o.   MRN: [id number] S: Feeling a little better today. O:BP 111/63 (BP Location: Left Arm)   Pulse 61   Temp 98.5 F (36.9 C) (Oral)   Resp 18   Ht 5\' 8"  (1.727 m)   Wt 78.6 kg   SpO2 98%   BMI 26.35 kg/m   Intake/Output Summary (Last 24 hours) at 10/26/2020 1420 Last data filed at 10/26/2020 1300 Gross per 24 hour  Intake 2265.13 ml  Output 4150 ml  Net -1884.87 ml   Intake/Output: I/O last 3 completed shifts: In: 3539.1 [P.O.:1780; I.V.:1759.1] Out: 6825 [Urine:6825]  Intake/Output this shift:  Total I/O In: 720 [P.O.:720] Out: 600 [Urine:600] Weight change:  Gen: NAD CVS: RRR Resp: cta Abd: +BS, soft, NT/Nd Ext: no edema  Recent Labs  Lab 10/20/20 0348 10/21/20 0150 10/22/20 0455 10/23/20 0209 10/24/20 0421 10/25/20 0335 10/26/20 0306  NA 134* 134* 136 136 138 141 141  K 3.6 3.7 3.7 3.5 3.5 4.0 4.2  CL 101 98 96* 98 98 104 104  CO2 18* 23 25 24 26 27 28   GLUCOSE 112* 168* 90 136* 117* 84 89  BUN 110* 119* 124* 119* 105* 88* 71*  CREATININE 11.18* 11.34* 11.21* 10.20* 8.02* 6.38* 4.60*  ALBUMIN 2.2* 2.2* 2.4* 2.3* 2.3* 2.3* 2.4*  CALCIUM 7.9* 7.9* 8.3* 8.2* 8.2* 8.4* 8.2*  PHOS 7.7* 7.7* 7.2* 7.1* 6.5* 4.4 4.6  AST 41 38  --   --   --   --   --   ALT 66* 63*  --   --   --   --   --    Liver Function Tests: Recent Labs  Lab 10/20/20 0348 10/21/20 0150 10/22/20 0455 10/24/20 0421 10/25/20 0335 10/26/20 0306  AST 41 38  --   --   --   --   ALT 66* 63*  --   --   --   --   ALKPHOS 39 37*  --   --   --   --   BILITOT 0.6 0.7  --   --   --   --   PROT 4.3* 4.3*  --   --   --   --   ALBUMIN 2.2* 2.2*   < > 2.3* 2.3* 2.4*   < > = values in this interval not displayed.   No results for input(s): LIPASE, AMYLASE in the last 168 hours. No results for input(s): AMMONIA in the last 168 hours. CBC: Recent Labs  Lab 10/20/20 0348 10/21/20 0150 10/22/20 0455 10/23/20 0209 10/24/20 0421  WBC  12.3* 13.3* 15.7* 14.3* 15.5*  NEUTROABS 9.2* 10.5* 11.2* 10.2* 11.8*  HGB 10.5* 10.7* 10.9* 10.3* 9.9*  HCT 28.5* 30.0* 31.2* 28.3* 29.1*  MCV 81.4 81.3 83.0 82.7 85.8  PLT 214 232 231 212 212   Cardiac Enzymes: Recent Labs  Lab 10/20/20 0348 10/21/20 0150  CKTOTAL 3,330* 2,647*   CBG: Recent Labs  Lab 10/25/20 1140 10/25/20 1635 10/25/20 2138 10/26/20 0653 10/26/20 1119  GLUCAP 70 78 81 80 84    Iron Studies: No results for input(s): IRON, TIBC, TRANSFERRIN, FERRITIN in the last 72 hours. Studies/Results: No results found. . Chlorhexidine Gluconate Cloth  6 each Topical Daily  . fluticasone  1 spray Each Nare Daily  . heparin  5,000 Units Subcutaneous Q8H  . insulin aspart  0-9 Units Subcutaneous TID AC  . pantoprazole  40 mg Oral Daily  . vitamin  B-12  100 mcg Oral Daily    BMET    Component Value Date/Time   NA 141 10/26/2020 0306   K 4.2 10/26/2020 0306   CL 104 10/26/2020 0306   CO2 28 10/26/2020 0306   GLUCOSE 89 10/26/2020 0306   BUN 71 (H) 10/26/2020 0306   CREATININE 4.60 (H) 10/26/2020 0306   CALCIUM 8.2 (L) 10/26/2020 0306   GFRNONAA 16 (L) 10/26/2020 0306   GFRAA >60 02/07/2020 1448   CBC    Component Value Date/Time   WBC 15.5 (H) 10/24/2020 0421   RBC 3.39 (L) 10/24/2020 0421   HGB 9.9 (L) 10/24/2020 0421   HCT 29.1 (L) 10/24/2020 0421   PLT 212 10/24/2020 0421   MCV 85.8 10/24/2020 0421   MCH 29.2 10/24/2020 0421   MCHC 34.0 10/24/2020 0421   RDW 14.3 10/24/2020 0421   LYMPHSABS 2.0 10/24/2020 0421   MONOABS 1.3 (H) 10/24/2020 0421   EOSABS 0.0 10/24/2020 0421   BASOSABS 0.0 10/24/2020 0421     Assessment/Plan:  1. AKI, non-oliguric- due to acute rhabdomyolysis. CK >50,000 and renal US without obstruction. Improving slowly with IVF's. Has RIJ temp HD cath placed but has not required dialysis. 1. Resumed IVF's 10/24/20 with marked improvement of UOP. 2. Will stop IVF's today and see how he does with po intake and renal  function. 3. No indication for dialysis and will remove temp HD catheter. 2. Acute rhabdomyolysis- improving with IVF's. Possibly due to covid vaccination. 3. Volume overload with scrotal edema, improving with increased UOP 4. Hyperkalemia- resolved.   Irena Cords, MD [employer] [phone]

## 2020-10-26 NOTE — Progress Notes (Signed)
Physical Therapy Treatment and Discharge Patient Details Name: William Quinn MRN: [id number] DOB: [birth year] Today's Date: 10/26/2020    History of Present Illness 34 yo male presenting to ED with numbness and pain of RLE. found to have acute kidney injury with elevated BUN and creatinine. PMH including avascular necrosis, narcolepsy, and type 2 diabetes.    PT Comments    Pt met his goals during his inpatient stays. Ambulating hallway distances with no AD and negotiated a half a flight of steps without physical difficulty. Education provided regarding exercise/activity recommendations. Pt verbalizes understanding and has no further questions. PT signing off.    Follow Up Recommendations  No PT follow up     Equipment Recommendations  None recommended by PT    Recommendations for Other Services       Precautions / Restrictions Precautions Precautions: None Restrictions Weight Bearing Restrictions: No    Mobility  Bed Mobility               General bed mobility comments: Sitting EOB  Transfers Overall transfer level: Independent Equipment used: None                Ambulation/Gait Ambulation/Gait assistance: Modified independent (Device/Increase time) Gait Distance (Feet): 400 Feet Assistive device: None Gait Pattern/deviations: Wide base of support;Step-through pattern;Decreased stride length Gait velocity: decr   General Gait Details: Decreased gait speed for age, wider BOS, no evidence of gross instability   Stairs Stairs: Yes Stairs assistance: Modified independent (Device/Increase time) Stair Management: One rail Right;No rails Number of Stairs: 10 General stair comments: Cues for step by step pattern   Wheelchair Mobility    Modified Rankin (Stroke Patients Only)       Balance Overall balance assessment: No apparent balance deficits (not formally assessed)                                          Cognition  Arousal/Alertness: Awake/alert Behavior During Therapy: WFL for tasks assessed/performed Overall Cognitive Status: Within Functional Limits for tasks assessed                                        Exercises      General Comments        Pertinent Vitals/Pain Pain Assessment: Faces Faces Pain Scale: Hurts a little bit Pain Location: R LE Pain Descriptors / Indicators: Grimacing;Aching;Sharp Pain Intervention(s): Monitored during session    Home Living                      Prior Function            PT Goals (current goals can now be found in the care plan section) Acute Rehab PT Goals Patient Stated Goal: feel better Potential to Achieve Goals: Good Progress towards PT goals: Progressing toward goals    Frequency    Min 3X/week      PT Plan Current plan remains appropriate    Co-evaluation              AM-PAC PT "6 Clicks" Mobility   Outcome Measure  Help needed turning from your back to your side while in a flat bed without using bedrails?: None Help needed moving from lying on your back to sitting on the side of a flat  bed without using bedrails?: None Help needed moving to and from a bed to a chair (including a wheelchair)?: None Help needed standing up from a chair using your arms (e.g., wheelchair or bedside chair)?: None Help needed to walk in hospital room?: None Help needed climbing 3-5 steps with a railing? : None 6 Click Score: 24    End of Session   Activity Tolerance: Patient tolerated treatment well Patient left: with call bell/phone within reach;in bed Nurse Communication: Mobility status PT Visit Diagnosis: Muscle weakness (generalized) (M62.81);Difficulty in walking, not elsewhere classified (R26.2);Pain Pain - Right/Left: Right Pain - part of body: Leg     Time: 1530-1540 PT Time Calculation (min) (ACUTE ONLY): 10 min  Charges:  $Therapeutic Activity: 8-22 mins                     Wyona Almas, PT,  DPT Acute Rehabilitation Services Pager [phone] Office [phone]    [name] 10/26/2020, 4:12 PM

## 2020-10-26 NOTE — Progress Notes (Signed)
PROGRESS NOTE    William Quinn  DPO:[id number] DOB: 31-Oct-1986 DOA: 10/15/2020 PCP: Iven Finn., MD    Brief Narrative:  34 year old with past medical history significant for avascular necrosis, narcolepsy, type 2 diabetes who presented to the ED for evaluation of numbness of his right leg.  Patient reported his symptoms started the day prior to admission.  He report having right lower extremity pain numbness and tingling.  Patient numbness is in a stocking type pattern.  Patient was found to have acute kidney injury with elevated BUN and creatinine. He was found to have also rhabdomyolysis with CK more than 50,000.    Consultants:   nephrology  Procedures:   Antimicrobials:       Subjective: Still c/o RLE swelling . No other new issues. Urinating ok.  Objective: Vitals:   10/25/20 0929 10/25/20 1816 10/25/20 2137 10/26/20 0415  BP: 125/67 122/81 110/64 120/71  Pulse: 63 79 61 (!) 57  Resp: 18 18 18 17   Temp: 98.8 F (37.1 C) 98.2 F (36.8 C) 98.2 F (36.8 C) 98.7 F (37.1 C)  TempSrc: Oral   Oral  SpO2: 96% 100% 98% 98%  Weight:      Height:        Intake/Output Summary (Last 24 hours) at 10/26/2020 0922 Last data filed at 10/26/2020 0824 Gross per 24 hour  Intake 2638.43 ml  Output 4525 ml  Net -1886.57 ml   Filed Weights   10/23/20 2140 10/24/20 0500 10/24/20 2038  Weight: 81.6 kg 81.6 kg 78.6 kg    Examination:  General exam: Appears calm and comfortable  Respiratory system: Clear to auscultation. Respiratory effort normal. Cardiovascular system: S1 & S2 heard, RRR. No JVD, murmurs, rubs, gallops or clicks. No pedal edema. Gastrointestinal system: Abdomen is nondistended, soft and nontender Normal bowel sounds heard. Central nervous system: Alert and oriented. No focal neurological deficits. Extremities: RLE swelling up to thigh. Trace edema of LLE. Not tight Skin: Warm dry Psychiatry: Judgement and insight appear normal. Mood & affect  appropriate.     Data Reviewed: I have personally reviewed following labs and imaging studies  CBC: Recent Labs  Lab 10/20/20 0348 10/21/20 0150 10/22/20 0455 10/23/20 0209 10/24/20 0421  WBC 12.3* 13.3* 15.7* 14.3* 15.5*  NEUTROABS 9.2* 10.5* 11.2* 10.2* 11.8*  HGB 10.5* 10.7* 10.9* 10.3* 9.9*  HCT 28.5* 30.0* 31.2* 28.3* 29.1*  MCV 81.4 81.3 83.0 82.7 85.8  PLT 214 232 231 212 212   Basic Metabolic Panel: Recent Labs  Lab 10/20/20 0348 10/21/20 0150 10/22/20 0455 10/23/20 0209 10/24/20 0421 10/25/20 0335 10/26/20 0306  NA 134* 134* 136 136 138 141 141  K 3.6 3.7 3.7 3.5 3.5 4.0 4.2  CL 101 98 96* 98 98 104 104  CO2 18* 23 25 24 26 27 28   GLUCOSE 112* 168* 90 136* 117* 84 89  BUN 110* 119* 124* 119* 105* 88* 71*  CREATININE 11.18* 11.34* 11.21* 10.20* 8.02* 6.38* 4.60*  CALCIUM 7.9* 7.9* 8.3* 8.2* 8.2* 8.4* 8.2*  MG 2.4 2.3  --   --   --   --   --   PHOS 7.7* 7.7* 7.2* 7.1* 6.5* 4.4 4.6   GFR: Estimated Creatinine Clearance: 21.5 mL/min (A) (by C-G formula based on SCr of 4.6 mg/dL (H)). Liver Function Tests: Recent Labs  Lab 10/20/20 0348 10/21/20 0150 10/22/20 0455 10/23/20 0209 10/24/20 0421 10/25/20 0335 10/26/20 0306  AST 41 38  --   --   --   --   --  ALT 66* 63*  --   --   --   --   --   ALKPHOS 39 37*  --   --   --   --   --   BILITOT 0.6 0.7  --   --   --   --   --   PROT 4.3* 4.3*  --   --   --   --   --   ALBUMIN 2.2* 2.2* 2.4* 2.3* 2.3* 2.3* 2.4*   No results for input(s): LIPASE, AMYLASE in the last 168 hours. No results for input(s): AMMONIA in the last 168 hours. Coagulation Profile: No results for input(s): INR, PROTIME in the last 168 hours. Cardiac Enzymes: Recent Labs  Lab 10/20/20 0348 10/21/20 0150  CKTOTAL 3,330* 2,647*   BNP (last 3 results) No results for input(s): PROBNP in the last 8760 hours. HbA1C: No results for input(s): HGBA1C in the last 72 hours. CBG: Recent Labs  Lab 10/25/20 0641 10/25/20 1140  10/25/20 1635 10/25/20 2138 10/26/20 0653  GLUCAP 76 70 78 81 80   Lipid Profile: No results for input(s): CHOL, HDL, LDLCALC, TRIG, CHOLHDL, LDLDIRECT in the last 72 hours. Thyroid Function Tests: No results for input(s): TSH, T4TOTAL, FREET4, T3FREE, THYROIDAB in the last 72 hours. Anemia Panel: No results for input(s): VITAMINB12, FOLATE, FERRITIN, TIBC, IRON, RETICCTPCT in the last 72 hours. Sepsis Labs: No results for input(s): PROCALCITON, LATICACIDVEN in the last 168 hours.  No results found for this or any previous visit (from the past 240 hour(s)).       Radiology Studies: No results found.      Scheduled Meds: . Chlorhexidine Gluconate Cloth  6 each Topical Daily  . fluticasone  1 spray Each Nare Daily  . heparin  5,000 Units Subcutaneous Q8H  . insulin aspart  0-9 Units Subcutaneous TID AC  . pantoprazole  40 mg Oral Daily  . vitamin B-12  100 mcg Oral Daily   Continuous Infusions: . sodium chloride 50 mL/hr at 10/26/20 0531    Assessment & Plan:   Active Problems:   AKI (acute kidney injury) (HCC)   Lower extremity weakness   Hyponatremia   Narcolepsy   Avascular necrosis (HCC)   Type 2 diabetes mellitus without complication (HCC)   Rhabdomyolysis   Weakness of left lower extremity   1-AKI probably related to Rhabdomyolysis: Metabolic acidosis  Nephrology is following. Renal failure secondary to rhabdomyolysis. Nephrology has concern that covid vaccine might have cause rhabdo.  Had HD catheter placement on 12/16.  He denies taking dextromethorphan recently.  CK >50,000 and renal US without obstruction Improving slowly with IV fluids. Good improvement of urine output Per nephrology we will stop IV fluids today and see how he does with p.o. intake and renal function No indication for dialysis and will remove temporary HD catheter Check CK in a.m. Creatinine today 4.60   Acute right lower extremity weakness numbness/edema/scrotal edema- CT  lumbar spine no evidence of disc herniation. He was a started on Solu-Medrol.  This was discontinued. TSH 0.199, free T3 1.2 low   free T4 normal. Might be related to sick euthyroid. Needs follow up thyroid function test in 2 weeks.  B12 level low normal. Started  supplement.  Taper prednisone. Last dose 12/20. Doppler negative for DVT. Numbness improved.  Swellings improving with increased UOP  Diabetes  type II: BG stable  Continue to hold Metformin  Continue R-ISS   Hyponatremia:  Resolved Remains stable  History of  a vascular necrosis: Presumably he was treated with long-term steroid for presumed multiple sclerosis.  In 2015 or 16 it was decided that he is did not have multiple sclerosis and oral steroids were discontinued.  Hyponatremia: Resolved.   Questionable gout: Uric acid at 12: On prednisone daily now, taper prednisone.   Hyperkalemia: Received Lokelma Resolved  Dyspnea; pleural effusion, atelectasis vs PNA.  Afebrile.  Incentive spirometry/  Will treated for PNA for 5 days. Day 5/5. WBC fluctuates.   Transaminases; likely related to Rhabdomyolysis. Trending down.   Abdominal pain, constipation;  Sorbitol ordered.  Proronix ordered.    DVT prophylaxis: Heparin Code Status: Full Family Communication: None at bedside  Status is: Inpatient  Remains inpatient appropriate because:Inpatient level of care appropriate due to severity of illness   Dispo: The patient is from: Home              Anticipated d/c is to: Home              Anticipated d/c date is: 2 days              Patient currently is not medically stable to d/c. Discharge when ok by nephrology, awaiting renal function recovery              LOS: 11 days   Time spent: 35 minutes with more than 50% on COC    Lynn Ito, MD [facility] Pager [phone]  If 7PM-7AM, please contact night-coverage 10/26/2020, 9:22 AM

## 2020-10-26 NOTE — Progress Notes (Signed)
Occupational Therapy Treatment Patient Details Name: William Quinn MRN: [id number] DOB: 08-07-1986 Today's Date: 10/26/2020    History of present illness 34 yo male presenting to ED with numbness and pain of RLE. found to have acute kidney injury with elevated BUN and creatinine. PMH including avascular necrosis, narcolepsy, and type 2 diabetes.   OT comments  Pt education for energy conservation techniques performed and handout provided. Pt reaching BLEs in bed for donning/doffing socks. Pt given shaving supplies, but will be performed later due to dinner being served. Pt performing minimal mobility today and possibly unmotivated to leave hospital. Pt not wanting to elaborate on assist level at home so one can assume he has no assist. Pt reports minimal pain in RLE. OT education on need to ambulate in hallway everyday now that PT has d/c'd him. OT to see 1x more for ADL routine/standing tolerance for ADL. OT following acutely.    Follow Up Recommendations  No OT follow up;Supervision - Intermittent    Equipment Recommendations  3 in 1 bedside commode    Recommendations for Other Services      Precautions / Restrictions Precautions Precautions: None Restrictions Weight Bearing Restrictions: No       Mobility Bed Mobility Overal bed mobility: Modified Independent             General bed mobility comments: Sitting EOB  Transfers Overall transfer level: Independent Equipment used: None                  Balance Overall balance assessment: No apparent balance deficits (not formally assessed)                                         ADL either performed or assessed with clinical judgement   ADL Overall ADL's : Needs assistance/impaired                     Lower Body Dressing: Set up;Bed level Lower Body Dressing Details (indicate cue type and reason): Pt reaching forward for sock donning/doffing of RLE               General ADL  Comments: Increased time for processing; declined OOB ADL at this time as dinner had arrived. Education provided for energy conservation tasks.     Vision       Perception     Praxis      Cognition Arousal/Alertness: Awake/alert Behavior During Therapy: WFL for tasks assessed/performed Overall Cognitive Status: Difficult to assess                                 General Comments: Pt appears to be delayed with responses; this could be possible effect of pain medications. RN reported he appeared loopy, but asks frequently for pain meds.        Exercises     Shoulder Instructions       General Comments VSS on RA.Pt asking for shaving supplies and RN said it was okay.    Pertinent Vitals/ Pain       Pain Assessment: Faces Faces Pain Scale: Hurts a little bit Pain Location: R LE Pain Descriptors / Indicators: Grimacing;Sharp Pain Intervention(s): Monitored during session  Home Living  Prior Functioning/Environment              Frequency  Min 2X/week        Progress Toward Goals  OT Goals(current goals can now be found in the care plan section)  Progress towards OT goals: Progressing toward goals  Acute Rehab OT Goals Patient Stated Goal: feel better OT Goal Formulation: With patient Time For Goal Achievement: 11/04/20 Potential to Achieve Goals: Good ADL Goals Pt Will Perform Grooming: with modified independence;standing Pt Will Perform Lower Body Dressing: with modified independence;sit to/from stand Pt Will Transfer to Toilet: with modified independence;ambulating;regular height toilet Pt Will Perform Toileting - Clothing Manipulation and hygiene: with modified independence;sit to/from stand Pt Will Perform Tub/Shower Transfer: Shower transfer;with supervision;rolling walker;3 in 1  Plan Discharge plan remains appropriate    Co-evaluation                 AM-PAC OT "6  Clicks" Daily Activity     Outcome Measure   Help from another person eating meals?: None Help from another person taking care of personal grooming?: A Little Help from another person toileting, which includes using toliet, bedpan, or urinal?: A Little Help from another person bathing (including washing, rinsing, drying)?: None Help from another person to put on and taking off regular upper body clothing?: None Help from another person to put on and taking off regular lower body clothing?: A Little 6 Click Score: 21    End of Session Equipment Utilized During Treatment: Rolling walker  OT Visit Diagnosis: Unsteadiness on feet (R26.81);Pain Pain - Right/Left: Right Pain - part of body: Leg   Activity Tolerance Patient tolerated treatment well   Patient Left in bed;with call bell/phone within reach   Nurse Communication Mobility status        Time: 1700-1720 OT Time Calculation (min): 20 min  Charges: OT General Charges $OT Visit: 1 Visit OT Treatments $Self Care/Home Management : 8-22 mins  Flora Lipps, OTR/L Acute Rehabilitation Services Pager: [phone] Office: [phone]    Jahvier Aldea C 10/26/2020, 5:32 PM

## 2020-10-27 LAB — RENAL FUNCTION PANEL
Albumin: 2.5 g/dL — ABNORMAL LOW (ref 3.5–5.0)
Anion gap: 10 (ref 5–15)
BUN: 57 mg/dL — ABNORMAL HIGH (ref 6–20)
CO2: 25 mmol/L (ref 22–32)
Calcium: 8.4 mg/dL — ABNORMAL LOW (ref 8.9–10.3)
Chloride: 103 mmol/L (ref 98–111)
Creatinine, Ser: 3.62 mg/dL — ABNORMAL HIGH (ref 0.61–1.24)
GFR, Estimated: 21 mL/min — ABNORMAL LOW (ref >60)
Glucose, Bld: 88 mg/dL (ref 70–99)
Phosphorus: 5 mg/dL — ABNORMAL HIGH (ref 2.5–4.6)
Potassium: 4.4 mmol/L (ref 3.5–5.1)
Sodium: 138 mmol/L (ref 135–145)

## 2020-10-27 LAB — CBC
HCT: 25.8 % — ABNORMAL LOW (ref 39.0–52.0)
Hemoglobin: 9 g/dL — ABNORMAL LOW (ref 13.0–17.0)
MCH: 30.2 pg (ref 26.0–34.0)
MCHC: 34.9 g/dL (ref 30.0–36.0)
MCV: 86.6 fL (ref 80.0–100.0)
Platelets: 186 10*3/uL (ref 150–400)
RBC: 2.98 MIL/uL — ABNORMAL LOW (ref 4.22–5.81)
RDW: 14.2 % (ref 11.5–15.5)
WBC: 7.8 10*3/uL (ref 4.0–10.5)
nRBC: 0 % (ref 0.0–0.2)

## 2020-10-27 LAB — GLUCOSE, CAPILLARY
Glucose-Capillary: 78 mg/dL (ref 70–99)
Glucose-Capillary: 102 mg/dL — ABNORMAL HIGH (ref 70–99)
Glucose-Capillary: 72 mg/dL (ref 70–99)

## 2020-10-27 LAB — CK: Total CK: 390 U/L (ref 49–397)

## 2020-10-27 NOTE — Progress Notes (Signed)
Patient ID: William Quinn, male   DOB: 04/28/1986, 34 y.o.   MRN: [id number]    S: Feeling a little better today. 2800 UOP-  crt down again -  Bothered with edema that doesn't seem to extreme   O:BP 114/64 (BP Location: Left Arm)   Pulse (!) 57   Temp 98.5 F (36.9 C) (Oral)   Resp 18   Ht 5\' 8"  (1.727 m)   Wt 78.6 kg   SpO2 98%   BMI 26.35 kg/m   Intake/Output Summary (Last 24 hours) at 10/27/2020 1050 Last data filed at 10/27/2020 0655 Gross per 24 hour  Intake 840 ml  Output 2800 ml  Net -1960 ml   Intake/Output: I/O last 3 completed shifts: In: 2645.1 [P.O.:1680; I.V.:965.1] Out: 5800 [Urine:5800]  Intake/Output this shift:  No intake/output data recorded. Weight change:  Gen: NAD CVS: RRR Resp: cta Abd: +BS, soft, NT/Nd Ext: min edema  Recent Labs  Lab 10/21/20 0150 10/22/20 0455 10/23/20 0209 10/24/20 0421 10/25/20 0335 10/26/20 0306 10/27/20 0641  NA 134* 136 136 138 141 141 138  K 3.7 3.7 3.5 3.5 4.0 4.2 4.4  CL 98 96* 98 98 104 104 103  CO2 23 25 24 26 27 28 25   GLUCOSE 168* 90 136* 117* 84 89 88  BUN 119* 124* 119* 105* 88* 71* 57*  CREATININE 11.34* 11.21* 10.20* 8.02* 6.38* 4.60* 3.62*  ALBUMIN 2.2* 2.4* 2.3* 2.3* 2.3* 2.4* 2.5*  CALCIUM 7.9* 8.3* 8.2* 8.2* 8.4* 8.2* 8.4*  PHOS 7.7* 7.2* 7.1* 6.5* 4.4 4.6 5.0*  AST 38  --   --   --   --   --   --   ALT 63*  --   --   --   --   --   --    Liver Function Tests: Recent Labs  Lab 10/21/20 0150 10/22/20 0455 10/25/20 0335 10/26/20 0306 10/27/20 0641  AST 38  --   --   --   --   ALT 63*  --   --   --   --   ALKPHOS 37*  --   --   --   --   BILITOT 0.7  --   --   --   --   PROT 4.3*  --   --   --   --   ALBUMIN 2.2*   < > 2.3* 2.4* 2.5*   < > = values in this interval not displayed.   No results for input(s): LIPASE, AMYLASE in the last 168 hours. No results for input(s): AMMONIA in the last 168 hours. CBC: Recent Labs  Lab 10/21/20 0150 10/22/20 0455 10/23/20 0209 10/24/20 0421  10/27/20 0641  WBC 13.3* 15.7* 14.3* 15.5* 7.8  NEUTROABS 10.5* 11.2* 10.2* 11.8*  --   HGB 10.7* 10.9* 10.3* 9.9* 9.0*  HCT 30.0* 31.2* 28.3* 29.1* 25.8*  MCV 81.3 83.0 82.7 85.8 86.6  PLT 232 231 212 212 186   Cardiac Enzymes: Recent Labs  Lab 10/21/20 0150 10/27/20 0641  CKTOTAL 2,647* 390   CBG: Recent Labs  Lab 10/26/20 0653 10/26/20 1119 10/26/20 1639 10/26/20 2208 10/27/20 0654  GLUCAP 80 84 79 139* 78    Iron Studies: No results for input(s): IRON, TIBC, TRANSFERRIN, FERRITIN in the last 72 hours. Studies/Results: No results found. . Chlorhexidine Gluconate Cloth  6 each Topical Daily  . fluticasone  1 spray Each Nare Daily  . heparin  5,000 Units Subcutaneous Q8H  . insulin aspart  0-9 Units Subcutaneous TID AC  . pantoprazole  40 mg Oral Daily  . vitamin B-12  100 mcg Oral Daily    BMET    Component Value Date/Time   NA 138 10/27/2020 0641   K 4.4 10/27/2020 0641   CL 103 10/27/2020 0641   CO2 25 10/27/2020 0641   GLUCOSE 88 10/27/2020 0641   BUN 57 (H) 10/27/2020 0641   CREATININE 3.62 (H) 10/27/2020 0641   CALCIUM 8.4 (L) 10/27/2020 0641   GFRNONAA 21 (L) 10/27/2020 0641   GFRAA >60 02/07/2020 1448   CBC    Component Value Date/Time   WBC 7.8 10/27/2020 0641   RBC 2.98 (L) 10/27/2020 0641   HGB 9.0 (L) 10/27/2020 0641   HCT 25.8 (L) 10/27/2020 0641   PLT 186 10/27/2020 0641   MCV 86.6 10/27/2020 0641   MCH 30.2 10/27/2020 0641   MCHC 34.9 10/27/2020 0641   RDW 14.2 10/27/2020 0641   LYMPHSABS 2.0 10/24/2020 0421   MONOABS 1.3 (H) 10/24/2020 0421   EOSABS 0.0 10/24/2020 0421   BASOSABS 0.0 10/24/2020 0421     Assessment/Plan:  1. AKI, non-oliguric- due to acute rhabdomyolysis. CK >50,000 and renal US without obstruction. Improving slowly with IVF's. Had RIJ temp HD cath placed but did not required dialysis- now removed.  crt continuing to improve even off of IVF 2. Acute rhabdomyolysis- improving with IVF's. Possibly due to  covid vaccination. 3. Volume overload with scrotal edema, improving with increased UOP.  I would not give diuretics-  I think will improve slowly with time  4. Hyperkalemia- resolved.  I anticipate continued improvement in renal function.  From a kidney standpoint could be discharged to home.  I will arrange labs next week and follow up office visit to confirm recovery.  Renal will sign off, call with questions  Cecille Aver  [employer] [phone]

## 2020-10-27 NOTE — Progress Notes (Signed)
PROGRESS NOTE    William Quinn  EHU:[id number] DOB: [birth year] DOA: 10/15/2020 PCP: Iven Finn., MD    Brief Narrative:  34 year old with past medical history significant for avascular necrosis, narcolepsy, type 2 diabetes who presented to the ED for evaluation of numbness of his right leg.  Patient reported his symptoms started the day prior to admission.  He report having right lower extremity pain numbness and tingling.  Patient numbness is in a stocking type pattern.  Patient was found to have acute kidney injury with elevated BUN and creatinine. He was found to have also rhabdomyolysis with CK more than 50,000.  12/23-good po intake . Creatinine 3.62. Good UO  Consultants:   nephrology  Procedures:   Antimicrobials:       Subjective: Denies abd pain, n/v. Feels swollen still. Good UO  Objective: Vitals:   10/26/20 1000 10/26/20 1850 10/26/20 2229 10/27/20 0507  BP: 111/63 111/68 120/71 111/66  Pulse: 61 (!) 54 65 (!) 57  Resp: 18 20 (!) 21 18  Temp: 98.5 F (36.9 C) 98.7 F (37.1 C) 98.1 F (36.7 C) 98.6 F (37 C)  TempSrc: Oral Oral Oral Oral  SpO2:  90% 97% 99%  Weight:      Height:        Intake/Output Summary (Last 24 hours) at 10/27/2020 0842 Last data filed at 10/27/2020 7858 Gross per 24 hour  Intake 1080 ml  Output 2800 ml  Net -1720 ml   Filed Weights   10/23/20 2140 10/24/20 0500 10/24/20 2038  Weight: 81.6 kg 81.6 kg 78.6 kg    Examination: Nad, calm cta no w/r/r Regular s1/s2 no m/r Benign, +bs RLE edema, about the same, LLE no edema     Data Reviewed: I have personally reviewed following labs and imaging studies  CBC: Recent Labs  Lab 10/21/20 0150 10/22/20 0455 10/23/20 0209 10/24/20 0421 10/27/20 0641  WBC 13.3* 15.7* 14.3* 15.5* 7.8  NEUTROABS 10.5* 11.2* 10.2* 11.8*  --   HGB 10.7* 10.9* 10.3* 9.9* 9.0*  HCT 30.0* 31.2* 28.3* 29.1* 25.8*  MCV 81.3 83.0 82.7 85.8 86.6  PLT 232 231 212 212 186   Basic  Metabolic Panel: Recent Labs  Lab 10/21/20 0150 10/22/20 0455 10/23/20 0209 10/24/20 0421 10/25/20 0335 10/26/20 0306 10/27/20 0641  NA 134*   < > 136 138 141 141 138  K 3.7   < > 3.5 3.5 4.0 4.2 4.4  CL 98   < > 98 98 104 104 103  CO2 23   < > 24 26 27 28 25   GLUCOSE 168*   < > 136* 117* 84 89 88  BUN 119*   < > 119* 105* 88* 71* 57*  CREATININE 11.34*   < > 10.20* 8.02* 6.38* 4.60* 3.62*  CALCIUM 7.9*   < > 8.2* 8.2* 8.4* 8.2* 8.4*  MG 2.3  --   --   --   --   --   --   PHOS 7.7*   < > 7.1* 6.5* 4.4 4.6 5.0*   < > = values in this interval not displayed.   GFR: Estimated Creatinine Clearance: 27.3 mL/min (A) (by C-G formula based on SCr of 3.62 mg/dL (H)). Liver Function Tests: Recent Labs  Lab 10/21/20 0150 10/22/20 0455 10/23/20 0209 10/24/20 0421 10/25/20 0335 10/26/20 0306 10/27/20 0641  AST 38  --   --   --   --   --   --   ALT 63*  --   --   --   --   --   --  ALKPHOS 37*  --   --   --   --   --   --   BILITOT 0.7  --   --   --   --   --   --   PROT 4.3*  --   --   --   --   --   --   ALBUMIN 2.2*   < > 2.3* 2.3* 2.3* 2.4* 2.5*   < > = values in this interval not displayed.   No results for input(s): LIPASE, AMYLASE in the last 168 hours. No results for input(s): AMMONIA in the last 168 hours. Coagulation Profile: No results for input(s): INR, PROTIME in the last 168 hours. Cardiac Enzymes: Recent Labs  Lab 10/21/20 0150 10/27/20 0641  CKTOTAL 2,647* 390   BNP (last 3 results) No results for input(s): PROBNP in the last 8760 hours. HbA1C: No results for input(s): HGBA1C in the last 72 hours. CBG: Recent Labs  Lab 10/26/20 0653 10/26/20 1119 10/26/20 1639 10/26/20 2208 10/27/20 0654  GLUCAP 80 84 79 139* 78   Lipid Profile: No results for input(s): CHOL, HDL, LDLCALC, TRIG, CHOLHDL, LDLDIRECT in the last 72 hours. Thyroid Function Tests: No results for input(s): TSH, T4TOTAL, FREET4, T3FREE, THYROIDAB in the last 72 hours. Anemia  Panel: No results for input(s): VITAMINB12, FOLATE, FERRITIN, TIBC, IRON, RETICCTPCT in the last 72 hours. Sepsis Labs: No results for input(s): PROCALCITON, LATICACIDVEN in the last 168 hours.  No results found for this or any previous visit (from the past 240 hour(s)).       Radiology Studies: No results found.      Scheduled Meds: . Chlorhexidine Gluconate Cloth  6 each Topical Daily  . fluticasone  1 spray Each Nare Daily  . heparin  5,000 Units Subcutaneous Q8H  . insulin aspart  0-9 Units Subcutaneous TID AC  . pantoprazole  40 mg Oral Daily  . vitamin B-12  100 mcg Oral Daily   Continuous Infusions: . sodium chloride 10 mL/hr at 10/26/20 1442    Assessment & Plan:   Active Problems:   AKI (acute kidney injury) (HCC)   Lower extremity weakness   Hyponatremia   Narcolepsy   Avascular necrosis (HCC)   Type 2 diabetes mellitus without complication (HCC)   Rhabdomyolysis   Weakness of left lower extremity   1-AKI probably related to Rhabdomyolysis: Metabolic acidosis  Nephrology is following. Renal failure secondary to rhabdomyolysis. Nephrology has concern that covid vaccine might have cause rhabdo.  Had HD catheter placement on 12/16.  He denies taking dextromethorphan recently.  CK >50,000 and renal US without obstruction 12/23-CK 390 Continues to have good urine output  Had RIJ temporary HD catheter placed but did not require dialysis-now removed  Creatinine improving off of IV fluid, creatinine 3.62 Encouraged p.o. intake      Acute right lower extremity weakness numbness/edema/scrotal edema- CT lumbar spine no evidence of disc herniation. He was a started on Solu-Medrol.  This was discontinued. TSH 0.199, free T3 1.2 low   free T4 normal. Might be related to sick euthyroid. Needs follow up thyroid function test in 2 weeks.  B12 level low normal. Started on supplement.  Tapered prednisone. Last dose 12/20. -Doppler neg. For DVT.  Numbness  improved Swelling improving with increased UOP.   Diabetes  type II: BG stable Hold metformin for now RISS   Hyponatremia:  Resolved Remained stable   History of a vascular necrosis: Presumably he was treated with long-term steroid  for presumed multiple sclerosis.  In 2015 or 16 it was decided that he is did not have multiple sclerosis and oral steroids were discontinued.  Hyponatremia: Resolved.   Questionable gout: Uric acid at 12: On prednisone daily now, taper prednisone.   Hyperkalemia: Received Lokelma Resolved  Dyspnea; pleural effusion, atelectasis vs PNA.  Afebrile.  Incentive spirometry/  Will treated for PNA for 5 days. Day 5/5. WBC fluctuates.   Transaminases; likely related to Rhabdomyolysis. Trending down.   Abdominal pain, constipation;  Sorbitol ordered.  Proronix ordered.    DVT prophylaxis: Heparin Code Status: Full Family Communication: None at bedside  Status is: Inpatient  Remains inpatient appropriate because:Inpatient level of care appropriate due to severity of illness   Dispo: The patient is from: Home              Anticipated d/c is to: Home              Anticipated d/c date is: 2 days              Patient currently is not medically stable to d/c. Discharge when ok by nephrology, awaiting renal function recovery              LOS: 12 days   Time spent: 35 minutes with more than 50% on COC    Lynn Ito, MD [facility] Pager [phone]  If 7PM-7AM, please contact night-coverage 10/27/2020, 8:42 AM

## 2020-10-28 ENCOUNTER — Inpatient Hospital Stay (HOSPITAL_COMMUNITY): Payer: Self-pay

## 2020-10-28 DIAGNOSIS — R509 Fever, unspecified: Secondary | ICD-10-CM

## 2020-10-28 DIAGNOSIS — N5082 Scrotal pain: Secondary | ICD-10-CM

## 2020-10-28 LAB — URINALYSIS, ROUTINE W REFLEX MICROSCOPIC
Bilirubin Urine: NEGATIVE
Glucose, UA: NEGATIVE mg/dL
Ketones, ur: NEGATIVE mg/dL
Leukocytes,Ua: NEGATIVE
Nitrite: NEGATIVE
Protein, ur: NEGATIVE mg/dL
Specific Gravity, Urine: 1.008 (ref 1.005–1.030)
pH: 7 (ref 5.0–8.0)

## 2020-10-28 LAB — RENAL FUNCTION PANEL
Albumin: 2.6 g/dL — ABNORMAL LOW (ref 3.5–5.0)
Anion gap: 9 (ref 5–15)
BUN: 49 mg/dL — ABNORMAL HIGH (ref 6–20)
CO2: 27 mmol/L (ref 22–32)
Calcium: 8.7 mg/dL — ABNORMAL LOW (ref 8.9–10.3)
Chloride: 104 mmol/L (ref 98–111)
Creatinine, Ser: 3.12 mg/dL — ABNORMAL HIGH (ref 0.61–1.24)
GFR, Estimated: 26 mL/min — ABNORMAL LOW (ref >60)
Glucose, Bld: 81 mg/dL (ref 70–99)
Phosphorus: 4.7 mg/dL — ABNORMAL HIGH (ref 2.5–4.6)
Potassium: 4.4 mmol/L (ref 3.5–5.1)
Sodium: 140 mmol/L (ref 135–145)

## 2020-10-28 LAB — GLUCOSE, CAPILLARY
Glucose-Capillary: 103 mg/dL — ABNORMAL HIGH (ref 70–99)
Glucose-Capillary: 76 mg/dL (ref 70–99)
Glucose-Capillary: 80 mg/dL (ref 70–99)
Glucose-Capillary: 102 mg/dL — ABNORMAL HIGH (ref 70–99)

## 2020-10-28 NOTE — Progress Notes (Addendum)
Occupational Therapy Treatment Patient Details Name: William Quinn MRN: [id number] DOB: 08/11/1986 Today's Date: 10/28/2020    History of present illness 34 yo male presenting to ED with numbness and pain of RLE. found to have acute kidney injury with elevated BUN and creatinine. PMH including avascular necrosis, narcolepsy, and type 2 diabetes.   OT comments  Pt progressing towards established OT goals. Pt performing ADLs and functional mobility with RW at Mod I level. Pt reports he feels comfortable with performing ADLs. Reviewing education and handout for energy conservation; pt verbalized understanding. Pt reports he has been performing mobility around unit twice a day. All acute OT needs met and will sign off. Continue to recommend dc to home once medically stable per physician.     Follow Up Recommendations  No OT follow up;Supervision - Intermittent    Equipment Recommendations  3 in 1 bedside commode    Recommendations for Other Services PT consult    Precautions / Restrictions Precautions Precautions: None Precaution Comments:       Mobility Bed Mobility Overal bed mobility: Modified Independent                Transfers Overall transfer level: Modified independent Equipment used: None Transfers: Sit to/from Stand Sit to Stand: Modified independent (Device/Increase time)         General transfer comment: use of RW    Balance Overall balance assessment: No apparent balance deficits (not formally assessed) Sitting-balance support: Feet supported Sitting balance-Leahy Scale: Good     Standing balance support: Bilateral upper extremity supported;During functional activity Standing balance-Leahy Scale: Fair                             ADL either performed or assessed with clinical judgement   ADL Overall ADL's : Needs assistance/impaired                                     Functional mobility during ADLs: Modified  independent;Rolling walker General ADL Comments: Reviewed energy conservation techniques for ADLs and IADLs. Reiewing handout in full. Pt reporting he feels comfortable with ADLs, light IADLs, and functional mobility     Vision   Vision Assessment?: No apparent visual deficits   Perception     Praxis      Cognition Arousal/Alertness: Awake/alert Behavior During Therapy: WFL for tasks assessed/performed Overall Cognitive Status: Within Functional Limits for tasks assessed                                 General Comments: Feel pt is baseline. Increased time for responding.        Exercises     Shoulder Instructions       General Comments      Pertinent Vitals/ Pain       Pain Assessment: 0-10 Pain Score: 7  Pain Location: R LE Pain Descriptors / Indicators: Sharp;Constant Pain Intervention(s): Monitored during session;Patient requesting pain meds-RN notified  Home Living                                          Prior Functioning/Environment              Frequency  Min 2X/week  Progress Toward Goals  OT Goals(current goals can now be found in the care plan section)  Progress towards OT goals: Progressing toward goals;Goals met/education completed, patient discharged from OT  Acute Rehab OT Goals Patient Stated Goal: feel better OT Goal Formulation: With patient Time For Goal Achievement: 11/04/20 Potential to Achieve Goals: Good ADL Goals Pt Will Perform Grooming: with modified independence;standing Pt Will Perform Lower Body Dressing: with modified independence;sit to/from stand Pt Will Transfer to Toilet: with modified independence;ambulating;regular height toilet Pt Will Perform Toileting - Clothing Manipulation and hygiene: with modified independence;sit to/from stand Pt Will Perform Tub/Shower Transfer: Shower transfer;with supervision;rolling walker;3 in 1  Plan Discharge plan remains appropriate;All goals  met and education completed, patient discharged from OT services    Co-evaluation                 AM-PAC OT "6 Clicks" Daily Activity     Outcome Measure   Help from another person eating meals?: None Help from another person taking care of personal grooming?: A Little Help from another person toileting, which includes using toliet, bedpan, or urinal?: A Little Help from another person bathing (including washing, rinsing, drying)?: None Help from another person to put on and taking off regular upper body clothing?: None Help from another person to put on and taking off regular lower body clothing?: A Little 6 Click Score: 21    End of Session Equipment Utilized During Treatment: Rolling walker  OT Visit Diagnosis: Unsteadiness on feet (R26.81);Pain Pain - Right/Left: Right Pain - part of body: Leg   Activity Tolerance Patient tolerated treatment well   Patient Left in bed;with call bell/phone within reach   Nurse Communication Mobility status        Time: 8138-8719 OT Time Calculation (min): 9 min  Charges: OT General Charges $OT Visit: 1 Visit OT Treatments $Self Care/Home Management : 8-22 mins  [place], OTR/L Acute Rehab Pager: [phone] Office: [place] 10/28/2020, 1:18 PM

## 2020-10-28 NOTE — Progress Notes (Addendum)
PROGRESS NOTE    William Quinn  XIP:[id number] DOB: [birth year] DOA: 10/15/2020 PCP: Iven Finn., MD    Brief Narrative:  34 year old with past medical history significant for avascular necrosis, narcolepsy, type 2 diabetes who presented to the ED for evaluation of numbness of his right leg.  Patient reported his symptoms started the day prior to admission.  He report having right lower extremity pain numbness and tingling.  Patient numbness is in a stocking type pattern.  Patient was found to have acute kidney injury with elevated BUN and creatinine. He was found to have also rhabdomyolysis with CK more than 50,000.  12/23-good po intake . Creatinine 3.62. Good UO 12/24-cr down to 3.12 Tmax 100.5  Consultants:   nephrology  Procedures:   Antimicrobials:       Subjective: Denies shortness of breath, chest pain, or chills.  Feels his right scrotal is painful.  Objective: Vitals:   10/27/20 1024 10/27/20 1609 10/27/20 2048 10/28/20 0430  BP: 114/64 109/71 107/70 111/69  Pulse: (!) 57 (!) 56 (!) 55 (!) 58  Resp: 18 16 18 18   Temp: 98.5 F (36.9 C) (!) 100.5 F (38.1 C) 99 F (37.2 C) 99 F (37.2 C)  TempSrc: Oral Oral Oral Oral  SpO2: 98% 99% 98% 99%  Weight:      Height:        Intake/Output Summary (Last 24 hours) at 10/28/2020 0844 Last data filed at 10/28/2020 0400 Gross per 24 hour  Intake 840 ml  Output 1800 ml  Net -960 ml   Filed Weights   10/23/20 2140 10/24/20 0500 10/24/20 2038  Weight: 81.6 kg 81.6 kg 78.6 kg    Examination: Calm, nad cta no w/r/r Regular, S1-S2 Soft benign positive bowel sounds Decreased scrotal edema Right lower extremity edema much improved, no left lower extremity edema Alert oriented x3     Data Reviewed: I have personally reviewed following labs and imaging studies  CBC: Recent Labs  Lab 10/22/20 0455 10/23/20 0209 10/24/20 0421 10/27/20 0641  WBC 15.7* 14.3* 15.5* 7.8  NEUTROABS 11.2* 10.2* 11.8*   --   HGB 10.9* 10.3* 9.9* 9.0*  HCT 31.2* 28.3* 29.1* 25.8*  MCV 83.0 82.7 85.8 86.6  PLT 231 212 212 186   Basic Metabolic Panel: Recent Labs  Lab 10/24/20 0421 10/25/20 0335 10/26/20 0306 10/27/20 0641 10/28/20 0303  NA 138 141 141 138 140  K 3.5 4.0 4.2 4.4 4.4  CL 98 104 104 103 104  CO2 26 27 28 25 27   GLUCOSE 117* 84 89 88 81  BUN 105* 88* 71* 57* 49*  CREATININE 8.02* 6.38* 4.60* 3.62* 3.12*  CALCIUM 8.2* 8.4* 8.2* 8.4* 8.7*  PHOS 6.5* 4.4 4.6 5.0* 4.7*   GFR: Estimated Creatinine Clearance: 31.7 mL/min (A) (by C-G formula based on SCr of 3.12 mg/dL (H)). Liver Function Tests: Recent Labs  Lab 10/24/20 0421 10/25/20 0335 10/26/20 0306 10/27/20 0641 10/28/20 0303  ALBUMIN 2.3* 2.3* 2.4* 2.5* 2.6*   No results for input(s): LIPASE, AMYLASE in the last 168 hours. No results for input(s): AMMONIA in the last 168 hours. Coagulation Profile: No results for input(s): INR, PROTIME in the last 168 hours. Cardiac Enzymes: Recent Labs  Lab 10/27/20 0641  CKTOTAL 390   BNP (last 3 results) No results for input(s): PROBNP in the last 8760 hours. HbA1C: No results for input(s): HGBA1C in the last 72 hours. CBG: Recent Labs  Lab 10/26/20 2208 10/27/20 0654 10/27/20 1139 10/27/20 1649 10/28/20 0732  GLUCAP 139* 78 102* 72 103*   Lipid Profile: No results for input(s): CHOL, HDL, LDLCALC, TRIG, CHOLHDL, LDLDIRECT in the last 72 hours. Thyroid Function Tests: No results for input(s): TSH, T4TOTAL, FREET4, T3FREE, THYROIDAB in the last 72 hours. Anemia Panel: No results for input(s): VITAMINB12, FOLATE, FERRITIN, TIBC, IRON, RETICCTPCT in the last 72 hours. Sepsis Labs: No results for input(s): PROCALCITON, LATICACIDVEN in the last 168 hours.  No results found for this or any previous visit (from the past 240 hour(s)).       Radiology Studies: No results found.      Scheduled Meds: . Chlorhexidine Gluconate Cloth  6 each Topical Daily  .  fluticasone  1 spray Each Nare Daily  . heparin  5,000 Units Subcutaneous Q8H  . insulin aspart  0-9 Units Subcutaneous TID AC  . pantoprazole  40 mg Oral Daily  . vitamin B-12  100 mcg Oral Daily   Continuous Infusions: . sodium chloride 10 mL/hr at 10/26/20 1442    Assessment & Plan:   Active Problems:   AKI (acute kidney injury) (HCC)   Lower extremity weakness   Hyponatremia   Narcolepsy   Avascular necrosis (HCC)   Type 2 diabetes mellitus without complication (HCC)   Rhabdomyolysis   Weakness of left lower extremity   1-AKI probably related to Rhabdomyolysis: Metabolic acidosis  Nephrology is following. Renal failure secondary to rhabdomyolysis. Nephrology has concern that covid vaccine might have cause rhabdo.  Had HD catheter placement on 12/16.  He denies taking dextromethorphan recently.  CK >50,000 and renal US without obstruction CK decreased to 390 Continues to have good urine output Creatinine slowly decreasing Encourage p.o. intake hydration Off of IV fluids RIJ temporary HD catheter removed since he did not require dialysis nephrology following    Acute right lower extremity weakness numbness/edema/scrotal edema- CT lumbar spine no evidence of disc herniation. He was a started on Solu-Medrol.  This was discontinued. TSH 0.199, free T3 1.2 low   free T4 normal. Might be related to sick euthyroid. Needs follow up thyroid function test in 2 weeks.  B12 level low normal. Started on supplement.  Tapered prednisone. Last dose 12/20. -Doppler neg. For DVT.  Numbness improved Swelling improving with increased UOP.  Fever- etiology unclear Does complain of right scrotal pain Will obtain scrotal ultrasound Check blood cultures and urine urine culture  Right scrotal pain-has scrotal edema which is improving Keep scrotum elevated will obtain scrotal ultrasound    Diabetes  type II: Blood glucose stable Holding Metformin R-ISS   Hyponatremia:   Resolved Remained stable   History of a vascular necrosis: Presumably he was treated with long-term steroid for presumed multiple sclerosis.  In 2015 or 16 it was decided that he is did not have multiple sclerosis and oral steroids were discontinued.  Hyponatremia: Resolved.   Questionable gout: Uric acid at 12: On prednisone daily now, taper prednisone.   Hyperkalemia: Received Lokelma Resolved  Dyspnea; pleural effusion, atelectasis vs PNA.  Afebrile.  Incentive spirometry/  Will treated for PNA for 5 days. Day 5/5. WBC fluctuates.   Transaminases; likely related to Rhabdomyolysis. Trending down.   Abdominal pain, constipation;  Sorbitol ordered.  Proronix ordered.    DVT prophylaxis: Heparin Code Status: Full Family Communication: None at bedside  Status is: Inpatient  Remains inpatient appropriate because:Inpatient level of care appropriate due to severity of illness   Dispo: The patient is from: Home  Anticipated d/c is to: Home              Anticipated d/c date is: 2 days              Patient currently is not medically stable to d/c. Discharge when ok by nephrology, awaiting renal function recovery .  Scrotal pain and fever             LOS: 13 days   Time spent: 45 minutes with more than 50% on COC    Lynn Ito, MD [facility] Pager [phone]  If 7PM-7AM, please contact night-coverage 10/28/2020, 8:44 AM

## 2020-10-29 LAB — RENAL FUNCTION PANEL
Albumin: 2.8 g/dL — ABNORMAL LOW (ref 3.5–5.0)
Anion gap: 10 (ref 5–15)
BUN: 42 mg/dL — ABNORMAL HIGH (ref 6–20)
CO2: 27 mmol/L (ref 22–32)
Calcium: 8.7 mg/dL — ABNORMAL LOW (ref 8.9–10.3)
Chloride: 102 mmol/L (ref 98–111)
Creatinine, Ser: 2.9 mg/dL — ABNORMAL HIGH (ref 0.61–1.24)
GFR, Estimated: 28 mL/min — ABNORMAL LOW (ref >60)
Glucose, Bld: 95 mg/dL (ref 70–99)
Phosphorus: 4.3 mg/dL (ref 2.5–4.6)
Potassium: 4.4 mmol/L (ref 3.5–5.1)
Sodium: 139 mmol/L (ref 135–145)

## 2020-10-29 LAB — CBC
HCT: 26.5 % — ABNORMAL LOW (ref 39.0–52.0)
Hemoglobin: 8.7 g/dL — ABNORMAL LOW (ref 13.0–17.0)
MCH: 28.9 pg (ref 26.0–34.0)
MCHC: 32.8 g/dL (ref 30.0–36.0)
MCV: 88 fL (ref 80.0–100.0)
Platelets: 196 10*3/uL (ref 150–400)
RBC: 3.01 MIL/uL — ABNORMAL LOW (ref 4.22–5.81)
RDW: 13.8 % (ref 11.5–15.5)
WBC: 8.6 10*3/uL (ref 4.0–10.5)
nRBC: 0 % (ref 0.0–0.2)

## 2020-10-29 LAB — GLUCOSE, CAPILLARY
Glucose-Capillary: 78 mg/dL (ref 70–99)
Glucose-Capillary: 93 mg/dL (ref 70–99)
Glucose-Capillary: 87 mg/dL (ref 70–99)
Glucose-Capillary: 92 mg/dL (ref 70–99)

## 2020-10-29 LAB — URINE CULTURE: Culture: NO GROWTH

## 2020-10-29 MED ORDER — SODIUM CHLORIDE 0.45 % IV SOLN: INTRAVENOUS | Status: DC

## 2020-10-29 MED ORDER — ENSURE SURGERY PO LIQD
237.0000 mL | Freq: Two times a day (BID) | ORAL | Status: DC
  Administered 2020-10-29 – 2020-10-31 (×5): 237 mL via ORAL
  Filled 2020-10-29 (×5): qty 237

## 2020-10-29 NOTE — Plan of Care (Signed)
  Problem: Health Behavior/Discharge Planning: Goal: Ability to manage health-related needs will improve Outcome: Completed/Met   Problem: Health Behavior/Discharge Planning: Goal: Ability to manage health-related needs will improve Outcome: Completed/Met   Problem: Clinical Measurements: Goal: Complications related to the disease process or treatment will be avoided or minimized Outcome: Completed/Met   Problem: Clinical Measurements: Goal: Complications related to the disease process or treatment will be avoided or minimized Outcome: Completed/Met

## 2020-10-29 NOTE — Plan of Care (Signed)
  Problem: Health Behavior/Discharge Planning: Goal: Ability to manage health-related needs will improve Outcome: Progressing   Problem: Activity: Goal: Risk for activity intolerance will decrease Outcome: Progressing   Problem: Activity: Goal: Activity intolerance will improve Outcome: Progressing

## 2020-10-29 NOTE — Progress Notes (Signed)
PROGRESS NOTE    William Quinn  FFM:[id number] DOB: 1986-10-16 DOA: 10/15/2020 PCP: Iven Finn., MD    Brief Narrative:  34 year old with past medical history significant for avascular necrosis, narcolepsy, type 2 diabetes who presented to the ED for evaluation of numbness of his right leg.  Patient reported his symptoms started the day prior to admission.  He report having right lower extremity pain numbness and tingling.  Patient numbness is in a stocking type pattern.  Patient was found to have acute kidney injury with elevated BUN and creatinine. He was found to have also rhabdomyolysis with CK more than 50,000.  12/23-good po intake . Creatinine 3.62. Good UO 12/24-cr down to 3.12 Tmax 100.5 12/25-afebrile overnight.    Consultants:   nephrology  Procedures:   Antimicrobials:       Subjective: Doing better today.  Good urine output.  Denies any chest pain, shortness of breath, feels scrotal pain is decreasing and the edema is down.  Objective: Vitals:   10/28/20 0947 10/28/20 1800 10/28/20 2042 10/29/20 0440  BP: 101/63 116/74 114/72 120/77  Pulse: (!) 57 63 62 60  Resp: 20 (!) 23 16 16   Temp: 97.9 F (36.6 C) 99.1 F (37.3 C) 97.9 F (36.6 C) 98.6 F (37 C)  TempSrc: Oral Oral Oral Oral  SpO2: 99% 98% 97% 97%  Weight:   72.4 kg   Height:        Intake/Output Summary (Last 24 hours) at 10/29/2020 0931 Last data filed at 10/29/2020 0813 Gross per 24 hour  Intake 1320 ml  Output 4725 ml  Net -3405 ml   Filed Weights   10/24/20 0500 10/24/20 2038 10/28/20 2042  Weight: 81.6 kg 78.6 kg 72.4 kg    Examination: Calm, comfortable NAD CTA no wheeze rales rhonchi Regular S1-S2 Soft benign positive bowel sounds Right lower extremity much improved from her edema now with mild edema, left lower extremity no edema Decrease scrotal edema Alert oriented x3, grossly intact    Data Reviewed: I have personally reviewed following labs and imaging  studies  CBC: Recent Labs  Lab 10/23/20 0209 10/24/20 0421 10/27/20 0641 10/29/20 0137  WBC 14.3* 15.5* 7.8 8.6  NEUTROABS 10.2* 11.8*  --   --   HGB 10.3* 9.9* 9.0* 8.7*  HCT 28.3* 29.1* 25.8* 26.5*  MCV 82.7 85.8 86.6 88.0  PLT 212 212 186 196   Basic Metabolic Panel: Recent Labs  Lab 10/25/20 0335 10/26/20 0306 10/27/20 0641 10/28/20 0303 10/29/20 0137  NA 141 141 138 140 139  K 4.0 4.2 4.4 4.4 4.4  CL 104 104 103 104 102  CO2 27 28 25 27 27   GLUCOSE 84 89 88 81 95  BUN 88* 71* 57* 49* 42*  CREATININE 6.38* 4.60* 3.62* 3.12* 2.90*  CALCIUM 8.4* 8.2* 8.4* 8.7* 8.7*  PHOS 4.4 4.6 5.0* 4.7* 4.3   GFR: Estimated Creatinine Clearance: 34.1 mL/min (A) (by C-G formula based on SCr of 2.9 mg/dL (H)). Liver Function Tests: Recent Labs  Lab 10/25/20 0335 10/26/20 0306 10/27/20 0641 10/28/20 0303 10/29/20 0137  ALBUMIN 2.3* 2.4* 2.5* 2.6* 2.8*   No results for input(s): LIPASE, AMYLASE in the last 168 hours. No results for input(s): AMMONIA in the last 168 hours. Coagulation Profile: No results for input(s): INR, PROTIME in the last 168 hours. Cardiac Enzymes: Recent Labs  Lab 10/27/20 0641  CKTOTAL 390   BNP (last 3 results) No results for input(s): PROBNP in the last 8760 hours. HbA1C: No  results for input(s): HGBA1C in the last 72 hours. CBG: Recent Labs  Lab 10/28/20 0732 10/28/20 1121 10/28/20 1655 10/28/20 2044 10/29/20 0639  GLUCAP 103* 76 80 102* 78   Lipid Profile: No results for input(s): CHOL, HDL, LDLCALC, TRIG, CHOLHDL, LDLDIRECT in the last 72 hours. Thyroid Function Tests: No results for input(s): TSH, T4TOTAL, FREET4, T3FREE, THYROIDAB in the last 72 hours. Anemia Panel: No results for input(s): VITAMINB12, FOLATE, FERRITIN, TIBC, IRON, RETICCTPCT in the last 72 hours. Sepsis Labs: No results for input(s): PROCALCITON, LATICACIDVEN in the last 168 hours.  Recent Results (from the past 240 hour(s))  Culture, blood (routine x 2)      Status: None (Preliminary result)   Collection Time: 10/28/20  2:19 PM   Specimen: BLOOD  Result Value Ref Range Status   Specimen Description BLOOD LEFT ANTECUBITAL  Final   Special Requests   Final    BOTTLES DRAWN AEROBIC AND ANAEROBIC Blood Culture adequate volume   Culture   Final    NO GROWTH <12 HOURS Performed at [facility], [address] [place], [place] [address]    Report Status PENDING  Incomplete  Culture, blood (routine x 2)     Status: None (Preliminary result)   Collection Time: 10/28/20  2:25 PM   Specimen: BLOOD  Result Value Ref Range Status   Specimen Description BLOOD RIGHT ANTECUBITAL  Final   Special Requests   Final    BOTTLES DRAWN AEROBIC AND ANAEROBIC Blood Culture adequate volume   Culture   Final    NO GROWTH <12 HOURS Performed at [facility], [address], [place], [place] [address]    Report Status PENDING  Incomplete         Radiology Studies: DG CHEST PORT 1 VIEW  Result Date: 10/28/2020 CLINICAL DATA:  Fevers EXAM: PORTABLE CHEST 1 VIEW COMPARISON:  10/20/2020 FINDINGS: Cardiac shadow is within normal limits. The lungs are well aerated bilaterally. Some left retrocardiac atelectasis is noted. No bony abnormality is seen. IMPRESSION: Increased airspace opacity in the left retrocardiac region. Electronically Signed   By: Alcide Clever M.D.   On: 10/28/2020 14:51   US SCROTUM W/DOPPLER  Result Date: 10/28/2020 CLINICAL DATA:  34 year old male with testicular pain x3 days. EXAM: SCROTAL ULTRASOUND DOPPLER ULTRASOUND OF THE TESTICLES TECHNIQUE: Complete ultrasound examination of the testicles, epididymis, and other scrotal structures was performed. Color and spectral Doppler ultrasound were also utilized to evaluate blood flow to the testicles. COMPARISON:  None. FINDINGS: Right testicle Measurements: 4.2 x 2.0 x 2.8 cm. No mass or microlithiasis visualized. Left testicle Measurements: 4.0 x 2.2 x 2.4 cm. No mass or  microlithiasis visualized. Right epididymis:  Normal in size and appearance. Left epididymis:  Normal in size and appearance. Hydrocele:  Small bilateral hydroceles. Varicocele:  None visualized. Pulsed Doppler interrogation of both testes demonstrates normal low resistance arterial and venous waveforms bilaterally. IMPRESSION: Small bilateral hydroceles, otherwise unremarkable testicular ultrasound. Electronically Signed   By: Elgie Collard M.D.   On: 10/28/2020 16:41        Scheduled Meds: . Chlorhexidine Gluconate Cloth  6 each Topical Daily  . fluticasone  1 spray Each Nare Daily  . heparin  5,000 Units Subcutaneous Q8H  . insulin aspart  0-9 Units Subcutaneous TID AC  . pantoprazole  40 mg Oral Daily  . vitamin B-12  100 mcg Oral Daily   Continuous Infusions: . sodium chloride 10 mL/hr at 10/26/20 1442  Assessment & Plan:   Active Problems:   AKI (acute kidney injury) (HCC)   Lower extremity weakness   Hyponatremia   Narcolepsy   Avascular necrosis (HCC)   Type 2 diabetes mellitus without complication (HCC)   Rhabdomyolysis   Weakness of left lower extremity   1-AKI probably related to Rhabdomyolysis: Metabolic acidosis  Nephrology is following. Renal failure secondary to rhabdomyolysis. Nephrology has concern that covid vaccine might have cause rhabdo.  Had HD catheter placement on 12/16.  He denies taking dextromethorphan recently.  CK >50,000 and renal US without obstruction CK decreased to 390 12/25-creatinine improving at 2.90  Making good urine output  Encourage p.o. intake for hydration  Off of IV fluids for the past few days  R IJ temporary HD catheter was removed due to not requiring dialysis  Continue to monitor levels    Fever- etiology unclear. Has remained afebrile overnight No leukocytosis Cultures pending Scrotal ultrasound with only small bilateral hydroceles Continue to monitor  Right scrotal pain-likely from scrotal edema, which is  improving  Scrotal ultrasound with small bilateral hydroceles  Continue keeping scrotal elevated     Diabetes  type II: BG stable  Continue holding Metformin  R-ISS     Acute right lower extremity weakness numbness/edema/scrotal edema- CT lumbar spine no evidence of disc herniation. He was a started on Solu-Medrol.  This was discontinued. TSH 0.199, free T3 1.2 low   free T4 normal. Might be related to sick euthyroid. Needs follow up thyroid function test in 2 weeks.  B12 level low normal. Started on supplement.  Tapered prednisone. Last dose 12/20. -Doppler neg. For DVT.  Numbness improved Swelling improving with increased UOP.   Hyponatremia:  Resolved Remained stable   History of a vascular necrosis: Presumably he was treated with long-term steroid for presumed multiple sclerosis.  In 2015 or 16 it was decided that he is did not have multiple sclerosis and oral steroids were discontinued.  Hyponatremia: Resolved.   Questionable gout: Uric acid at 12: On prednisone daily now, taper prednisone.   Hyperkalemia: Received Lokelma Resolved  Dyspnea; pleural effusion, atelectasis vs PNA.  Afebrile.  Incentive spirometry/  Will treated for PNA for 5 days. Day 5/5. WBC fluctuates.   Transaminases; likely related to Rhabdomyolysis. Trending down.   Abdominal pain, constipation;  Sorbitol ordered.  Proronix ordered.    DVT prophylaxis: Heparin Code Status: Full Family Communication: None at bedside  Status is: Inpatient  Remains inpatient appropriate because:Inpatient level of care appropriate due to severity of illness   Dispo: The patient is from: Home              Anticipated d/c is to: Home              Anticipated d/c date is: 2 days              Patient currently is not medically stable to d/c. Discharge when ok by nephrology, awaiting renal function recovery .               LOS: 14 days   Time spent:  35 minutes with more than 50%  on COC    Lynn Ito, MD [facility] Pager [phone]  If 7PM-7AM, please contact night-coverage 10/29/2020, 9:31 AM

## 2020-10-30 LAB — RENAL FUNCTION PANEL
Albumin: 3.1 g/dL — ABNORMAL LOW (ref 3.5–5.0)
Anion gap: 10 (ref 5–15)
BUN: 37 mg/dL — ABNORMAL HIGH (ref 6–20)
CO2: 26 mmol/L (ref 22–32)
Calcium: 9.1 mg/dL (ref 8.9–10.3)
Chloride: 100 mmol/L (ref 98–111)
Creatinine, Ser: 2.6 mg/dL — ABNORMAL HIGH (ref 0.61–1.24)
GFR, Estimated: 32 mL/min — ABNORMAL LOW (ref >60)
Glucose, Bld: 92 mg/dL (ref 70–99)
Phosphorus: 4.2 mg/dL (ref 2.5–4.6)
Potassium: 4.6 mmol/L (ref 3.5–5.1)
Sodium: 136 mmol/L (ref 135–145)

## 2020-10-30 LAB — GLUCOSE, CAPILLARY
Glucose-Capillary: 89 mg/dL (ref 70–99)
Glucose-Capillary: 92 mg/dL (ref 70–99)
Glucose-Capillary: 124 mg/dL — ABNORMAL HIGH (ref 70–99)
Glucose-Capillary: 84 mg/dL (ref 70–99)

## 2020-10-30 NOTE — Progress Notes (Signed)
PROGRESS NOTE    William CablesJoshua Quinn  HQI:[id number] DOB: [birth year] DOA: 10/15/2020 PCP: Iven FinnFeraru, Elaine R., MD    Brief Narrative:  34 year old with past medical history significant for avascular necrosis, narcolepsy, type 2 diabetes who presented to the ED for evaluation of numbness of his right leg.  Patient reported his symptoms started the day prior to admission.  He report having right lower extremity pain numbness and tingling.  Patient numbness is in a stocking type pattern.  Patient was found to have acute kidney injury with elevated BUN and creatinine. He was found to have also rhabdomyolysis with CK more than 50,000.  12/23-good po intake . Creatinine 3.62. Good UO 12/24-cr down to 3.12 Tmax 100.5 12/25-afebrile overnight.  12/26-creatinine down to 2.60  Consultants:   nephrology  Procedures:   Antimicrobials:       Subjective: Patient continues to drink his Ensure and ambulates.  Reports scrotal edema is improving and now he has less pain  Objective: Vitals:   10/29/20 1115 10/29/20 1600 10/29/20 2042 10/30/20 0530  BP: 117/78 128/68 119/66 118/73  Pulse: 62 60 64 63  Resp: 18 17 20 18   Temp: 98.7 F (37.1 C) 98.7 F (37.1 C) 99.5 F (37.5 C) 98.8 F (37.1 C)  TempSrc: Oral Oral Oral Oral  SpO2: 98% 96% 98% 98%  Weight:      Height:        Intake/Output Summary (Last 24 hours) at 10/30/2020 0829 Last data filed at 10/30/2020 0600 Gross per 24 hour  Intake 240 ml  Output 4750 ml  Net -4510 ml   Filed Weights   10/24/20 0500 10/24/20 2038 10/28/20 2042  Weight: 81.6 kg 78.6 kg 72.4 kg    Examination: Calm, comfortable CTA, no wheeze rales rhonchi's Regular S1-S2 Right lower extremity edema 90% better, left lower extremity no edema Alert oriented x3, grossly intact Scrotal edema has  decreased    Data Reviewed: I have personally reviewed following labs and imaging studies  CBC: Recent Labs  Lab 10/24/20 0421 10/27/20 0641 10/29/20 0137   WBC 15.5* 7.8 8.6  NEUTROABS 11.8*  --   --   HGB 9.9* 9.0* 8.7*  HCT 29.1* 25.8* 26.5*  MCV 85.8 86.6 88.0  PLT 212 186 196   Basic Metabolic Panel: Recent Labs  Lab 10/26/20 0306 10/27/20 0641 10/28/20 0303 10/29/20 0137 10/30/20 0257  NA 141 138 140 139 136  K 4.2 4.4 4.4 4.4 4.6  CL 104 103 104 102 100  CO2 28 25 27 27 26   GLUCOSE 89 88 81 95 92  BUN 71* 57* 49* 42* 37*  CREATININE 4.60* 3.62* 3.12* 2.90* 2.60*  CALCIUM 8.2* 8.4* 8.7* 8.7* 9.1  PHOS 4.6 5.0* 4.7* 4.3 4.2   GFR: Estimated Creatinine Clearance: 38 mL/min (A) (by C-G formula based on SCr of 2.6 mg/dL (H)). Liver Function Tests: Recent Labs  Lab 10/26/20 0306 10/27/20 0641 10/28/20 0303 10/29/20 0137 10/30/20 0257  ALBUMIN 2.4* 2.5* 2.6* 2.8* 3.1*   No results for input(s): LIPASE, AMYLASE in the last 168 hours. No results for input(s): AMMONIA in the last 168 hours. Coagulation Profile: No results for input(s): INR, PROTIME in the last 168 hours. Cardiac Enzymes: Recent Labs  Lab 10/27/20 0641  CKTOTAL 390   BNP (last 3 results) No results for input(s): PROBNP in the last 8760 hours. HbA1C: No results for input(s): HGBA1C in the last 72 hours. CBG: Recent Labs  Lab 10/29/20 [phone] 10/29/20 1144 10/29/20 1631 10/29/20 2044 10/30/20  0714  GLUCAP 78 93 87 92 89   Lipid Profile: No results for input(s): CHOL, HDL, LDLCALC, TRIG, CHOLHDL, LDLDIRECT in the last 72 hours. Thyroid Function Tests: No results for input(s): TSH, T4TOTAL, FREET4, T3FREE, THYROIDAB in the last 72 hours. Anemia Panel: No results for input(s): VITAMINB12, FOLATE, FERRITIN, TIBC, IRON, RETICCTPCT in the last 72 hours. Sepsis Labs: No results for input(s): PROCALCITON, LATICACIDVEN in the last 168 hours.  Recent Results (from the past 240 hour(s))  Culture, blood (routine x 2)     Status: None (Preliminary result)   Collection Time: 10/28/20  2:19 PM   Specimen: BLOOD  Result Value Ref Range Status    Specimen Description BLOOD LEFT ANTECUBITAL  Final   Special Requests   Final    BOTTLES DRAWN AEROBIC AND ANAEROBIC Blood Culture adequate volume   Culture   Final    NO GROWTH < 24 HOURS Performed at [facility], [address], [place], [place] [address]    Report Status PENDING  Incomplete  Culture, blood (routine x 2)     Status: None (Preliminary result)   Collection Time: 10/28/20  2:25 PM   Specimen: BLOOD  Result Value Ref Range Status   Specimen Description BLOOD RIGHT ANTECUBITAL  Final   Special Requests   Final    BOTTLES DRAWN AEROBIC AND ANAEROBIC Blood Culture adequate volume   Culture   Final    NO GROWTH < 24 HOURS Performed at [facility], [address] [place], [place] [address]    Report Status PENDING  Incomplete  Culture, Urine     Status: None   Collection Time: 10/28/20  3:29 PM   Specimen: Urine, Random  Result Value Ref Range Status   Specimen Description URINE, RANDOM  Final   Special Requests NONE  Final   Culture   Final    NO GROWTH Performed at [facility], [address], [place], [place] [address]    Report Status 10/29/2020 FINAL  Final         Radiology Studies: DG CHEST PORT 1 VIEW  Result Date: 10/28/2020 CLINICAL DATA:  Fevers EXAM: PORTABLE CHEST 1 VIEW COMPARISON:  10/20/2020 FINDINGS: Cardiac shadow is within normal limits. The lungs are well aerated bilaterally. Some left retrocardiac atelectasis is noted. No bony abnormality is seen. IMPRESSION: Increased airspace opacity in the left retrocardiac region. Electronically Signed   By: Alcide Clever M.D.   On: 10/28/2020 14:51   US SCROTUM W/DOPPLER  Result Date: 10/28/2020 CLINICAL DATA:  34 year old male with testicular pain x3 days. EXAM: SCROTAL ULTRASOUND DOPPLER ULTRASOUND OF THE TESTICLES TECHNIQUE: Complete ultrasound examination of the testicles, epididymis, and other scrotal structures was performed. Color and spectral Doppler ultrasound were also  utilized to evaluate blood flow to the testicles. COMPARISON:  None. FINDINGS: Right testicle Measurements: 4.2 x 2.0 x 2.8 cm. No mass or microlithiasis visualized. Left testicle Measurements: 4.0 x 2.2 x 2.4 cm. No mass or microlithiasis visualized. Right epididymis:  Normal in size and appearance. Left epididymis:  Normal in size and appearance. Hydrocele:  Small bilateral hydroceles. Varicocele:  None visualized. Pulsed Doppler interrogation of both testes demonstrates normal low resistance arterial and venous waveforms bilaterally. IMPRESSION: Small bilateral hydroceles, otherwise unremarkable testicular ultrasound. Electronically Signed   By: Elgie Collard M.D.   On: 10/28/2020 16:41        Scheduled Meds: . Chlorhexidine Gluconate Cloth  6 each Topical Daily  . feeding supplement  237 mL Oral BID BM  . fluticasone  1 spray Each Nare Daily  . heparin  5,000 Units Subcutaneous Q8H  . insulin aspart  0-9 Units Subcutaneous TID AC  . pantoprazole  40 mg Oral Daily  . vitamin B-12  100 mcg Oral Daily   Continuous Infusions: . sodium chloride 10 mL/hr at 10/26/20 1442    Assessment & Plan:   Active Problems:   AKI (acute kidney injury) (HCC)   Lower extremity weakness   Hyponatremia   Narcolepsy   Avascular necrosis (HCC)   Type 2 diabetes mellitus without complication (HCC)   Rhabdomyolysis   Weakness of left lower extremity   1-AKI probably related to Rhabdomyolysis: Metabolic acidosis  Nephrology is following. Renal failure secondary to rhabdomyolysis. Nephrology has concern that covid vaccine might have cause rhabdo.  Had HD catheter placement on 12/16.  He denies taking dextromethorphan recently.  CK >50,000 and renal US without obstruction CK decreased to 390 Encourage p.o. intake for hydration  Off of IV fluids for the past few days  R IJ temporary HD catheter was removed due to not requiring dialysis  12/26-creatinine slowly improving, cr 2.60 UO good Continue  to monitor   Fever- etiology unclear. Has remained afebrile overnight No leukocytosis 12/26= cultures to date negative Scrotal ultrasound with only small bilateral hydrocele, scrotal pain improving Urine culture negative today  Right scrotal pain-likely from scrotal edema, which is improving  Scrotal ultrasound with small bilateral hydroceles  12/26-continue with scrotal elevation    Diabetes  type II: BG stable Continue R-ISS Continue holding Metformin    Acute right lower extremity weakness numbness/edema/scrotal edema- CT lumbar spine no evidence of disc herniation. He was a started on Solu-Medrol.  This was discontinued. TSH 0.199, free T3 1.2 low   free T4 normal. Might be related to sick euthyroid. Needs follow up thyroid function test in 2 weeks.  B12 level low normal. Started on supplement.  Tapered prednisone. Last dose 12/20. -Doppler neg. For DVT.  Numbness improved Edema has 90% improved   Hyponatremia:  Resolved Remained stable   History of a vascular necrosis: Presumably he was treated with long-term steroid for presumed multiple sclerosis.  In 2015 or 16 it was decided that he is did not have multiple sclerosis and oral steroids were discontinued.  Hyponatremia: Resolved.   Questionable gout: Uric acid at 12: On prednisone daily now, taper prednisone.   Hyperkalemia: Received Lokelma Resolved and remained stable  Dyspnea; pleural effusion, atelectasis vs PNA.  Afebrile.  Incentive spirometry/  Will treated for PNA for 5 days. Day 5/5. WBC fluctuates.   Transaminases; likely related to Rhabdomyolysis. Trending down.   Abdominal pain, constipation;  Sorbitol ordered.  Proronix ordered.    DVT prophylaxis: Heparin Code Status: Full Family Communication: None at bedside  Status is: Inpatient  Remains inpatient appropriate because:Inpatient level of care appropriate due to severity of illness   Dispo: The patient is from: Home               Anticipated d/c is to: Home              Anticipated d/c date is: 2 days              Patient currently is not medically stable to d/c. Discharge when ok by nephrology, awaiting renal function recovery .               LOS: 15 days   Time spent:  35 minutes  with more than 50% on COC    Lynn Ito, MD [facility] Pager [phone]  If 7PM-7AM, please contact night-coverage 10/30/2020, 8:29 AM

## 2020-10-30 NOTE — Plan of Care (Signed)
  Problem: Activity: Goal: Risk for activity intolerance will decrease Outcome: Progressing   Problem: Safety: Goal: Ability to remain free from injury will improve Outcome: Progressing   Problem: Activity: Goal: Activity intolerance will improve Outcome: Progressing   

## 2020-10-31 ENCOUNTER — Other Ambulatory Visit (HOSPITAL_COMMUNITY): Payer: Self-pay | Admitting: Internal Medicine

## 2020-10-31 LAB — RENAL FUNCTION PANEL
Albumin: 3.2 g/dL — ABNORMAL LOW (ref 3.5–5.0)
Anion gap: 11 (ref 5–15)
BUN: 37 mg/dL — ABNORMAL HIGH (ref 6–20)
CO2: 27 mmol/L (ref 22–32)
Calcium: 9.1 mg/dL (ref 8.9–10.3)
Chloride: 101 mmol/L (ref 98–111)
Creatinine, Ser: 2.67 mg/dL — ABNORMAL HIGH (ref 0.61–1.24)
GFR, Estimated: 31 mL/min — ABNORMAL LOW (ref >60)
Glucose, Bld: 92 mg/dL (ref 70–99)
Phosphorus: 4.5 mg/dL (ref 2.5–4.6)
Potassium: 4.2 mmol/L (ref 3.5–5.1)
Sodium: 139 mmol/L (ref 135–145)

## 2020-10-31 LAB — GLUCOSE, CAPILLARY: Glucose-Capillary: 93 mg/dL (ref 70–99)

## 2020-10-31 MED ORDER — CYANOCOBALAMIN 100 MCG PO TABS: 100.0000 ug | ORAL_TABLET | Freq: Every day | ORAL | 0 refills | Status: AC

## 2020-10-31 MED ORDER — SENNOSIDES-DOCUSATE SODIUM 8.6-50 MG PO TABS
1.0000 | ORAL_TABLET | Freq: Every day | ORAL | Status: DC
  Administered 2020-10-31: 1 via ORAL
  Filled 2020-10-31: qty 1

## 2020-10-31 MED ORDER — PANTOPRAZOLE SODIUM 40 MG PO TBEC: 40.0000 mg | DELAYED_RELEASE_TABLET | Freq: Every day | ORAL | 0 refills | Status: DC

## 2020-10-31 MED ORDER — POLYETHYLENE GLYCOL 3350 17 G PO PACK: 17.0000 g | PACK | Freq: Every day | ORAL | 0 refills | Status: AC

## 2020-10-31 MED ORDER — POLYETHYLENE GLYCOL 3350 17 G PO PACK
17.0000 g | PACK | Freq: Every day | ORAL | Status: DC
  Administered 2020-10-31: 17 g via ORAL
  Filled 2020-10-31: qty 1

## 2020-10-31 MED FILL — VITAMIN B-12 1000 MCG TABS: 1000 | 30 days supply | Qty: 30 | Fill #0

## 2020-10-31 MED FILL — POLYETHYLENE GLYCOL 3350 PO: 17 | 30 days supply | Qty: 510 | Fill #0

## 2020-10-31 MED FILL — PANTOPRAZOLE SOD DR 40 MG T: 40 | 30 days supply | Qty: 30 | Fill #0

## 2020-10-31 NOTE — Discharge Summary (Addendum)
William Quinn QIH:[id number] DOB: 1986-02-18 DOA: 10/15/2020  PCP: William Finn., MD  Admit date: 10/15/2020 Discharge date: 10/31/2020  Admitted From: Home Disposition: Home  Recommendations for Outpatient Follow-up:  1. Follow up with PCP in 1 week 2. Please obtain BMP/CBC in one week 3. Nephrology Dr. Kathrene Quinn in 1 week     Discharge Condition:Stable CODE STATUS: Full Diet recommendation: Heart Healthy  Brief/Interim Summary: PER HPI: William Quinn is a 34 yo male with hx/o  Avascular necrosis, Narcolepsy, DM type presented to the ED for evaluation of numbness of his right leg.He was found with increase BUN and creatinine, hyponatremic. Also CK was elevated 9665. He was admitted, started on IVF. Renal US was ordered, found with Mild increase in echogenicity of the bilateral kidneys as can be seen in medical renal disease. Nephrology consulted.   1-AKI probably related to Rhabdomyolysis: Metabolic acidosis  Renal failure secondary to rhabdomyolysis. Nephrology has concern that covid vaccine might have cause rhabdomyolysis.  Had HD catheter placement on 12/16. CK >50,000 and renal US without obstruction...>CK improved with IVF to normal levels.  R IJ temporary HD catheter was removed due to not requiring dialysis  Overall creatinine improved , now stablized  Nephrology was following-via chat cleared pt for discharge today with outpatient followup He continues having good urine output  Fever- Had an episode of low grade fever. etiology unclear. No leukocytosis and has remained afebrile. Blood culture and urine culture to date negative Likely due to atelectasis    Right scrotal pain-likely from scrotal edema, which is improved and symptoms resolved. Scrotal ultrasound with small bilateral hydroceles      Diabetes type II: He is not taking Metformin as outpatient. A1c is 5.4 Needs to follow-up primary care for monitoring and management    Acute right  lower extremity weakness numbness/edema/scrotal edema- CT lumbar spine no evidence of disc herniation. He was a started on Solu-Medrol. This was discontinued. TSH 0.199, free T3 1.2 low free T4 normal. Might be related to sick euthyroid. Needs follow up thyroid function test in 2 weeks.  B12 level low normal. Started on supplement.  Tapered prednisone. Last dose12/20. -Doppler neg. For DVT.  Numbness improved Numbness likely due to increasing edema which has resolved and his symptoms have resolved.   Hyponatremia:  Resolved Remained stable   History of a vascular necrosis: Presumably he was treated with long-term steroid for presumed multiple sclerosis. In 2015 or 16 it was decided that he is did not have multiple sclerosis and oral steroids were discontinued.  Hyponatremia: Resolved.    Hyperkalemia: Received Lokelma Resolved and remained stable  Dyspnea; pleural effusion, atelectasis vs PNA. Afebrile.  Incentive spirometry/   treated for PNA for 5 days. Day5/5.   Transaminases; likely related to Rhabdomyolysis.  Trended down  Abdominal pain, constipation;  Started on MiraLAX and senna. Will DC on MiraLAX   Discharge Diagnoses:  Active Problems:   AKI (acute kidney injury) (HCC)   Lower extremity weakness   Hyponatremia   Narcolepsy   Avascular necrosis (HCC)   Type 2 diabetes mellitus without complication (HCC)   Rhabdomyolysis   Weakness of left lower extremity    Discharge Instructions  Discharge Instructions    Diet - low sodium heart healthy   Complete by: As directed    Discharge instructions   Complete by: As directed    Please follow up with your pcp for diabetes and blood work. Follow up with nephrology William Quinn in one week   Increase  activity slowly   Complete by: As directed      Allergies as of 10/31/2020   No Known Allergies     Medication List    STOP taking these medications   hydrOXYzine 25 MG  tablet Commonly known as: ATARAX/VISTARIL   metFORMIN 500 MG tablet Commonly known as: GLUCOPHAGE     TAKE these medications   cyanocobalamin 100 MCG tablet Take 1 tablet (100 mcg total) by mouth daily. Start taking on: November 01, 2020   pantoprazole 40 MG tablet Commonly known as: PROTONIX Take 1 tablet (40 mg total) by mouth daily. Start taking on: November 01, 2020   polyethylene glycol 17 g packet Commonly known as: MIRALAX / GLYCOLAX Take 17 g by mouth daily. Start taking on: November 01, 2020            Durable Medical Equipment  (From admission, onward)         Start     Ordered   10/31/20 1203  For home use only DME Walker rolling  Once       Question Answer Comment  Walker: With 5 Inch Wheels   Patient needs a walker to treat with the following condition Weakness      10/31/20 1203   10/24/20 0725  For home use only DME 3 n 1  Once        10/24/20 3818          Follow-up Information    William Sable, MD Follow up in 1 week(s).   Specialty: Nephrology Why: needs blood work. hospital f/u Contact information: [address] [place] [address] [phone]        William Shutter, MD Follow up in 1 week(s).   Specialty: Internal Medicine Contact information: [address] [place] [address] [phone]              No Known Allergies  Consultations:  Nephrology   Procedures/Studies: DG Chest 2 View  Result Date: 10/20/2020 CLINICAL DATA:  Numbness right leg. EXAM: CHEST - 2 VIEW COMPARISON:  10/16/2020 FINDINGS: Heart size upper normal.  Vascularity normal. Left lower lobe airspace density has developed since the prior study possibly atelectasis. Small bilateral pleural effusions. Negative for pulmonary edema. IMPRESSION: Small bilateral pleural effusions.  Negative for heart failure. Left lower lobe airspace disease likely atelectasis or pneumonia. Electronically Signed   By: Marlan Palau M.D.   On: 10/20/2020  11:14   CT LUMBAR SPINE WO CONTRAST  Result Date: 10/15/2020 CLINICAL DATA:  Larey Seat yesterday.  Back and right leg pain. EXAM: CT LUMBAR SPINE WITHOUT CONTRAST TECHNIQUE: Multidetector CT imaging of the lumbar spine was performed without intravenous contrast administration. Multiplanar CT image reconstructions were also generated. COMPARISON:  None available. FINDINGS: Segmentation: There are five lumbar type vertebral bodies. The last full intervertebral disc space is labeled L5-S1. Alignment: Mild left convex lumbar scoliosis. Normal alignment in the sagittal plane. Vertebrae: No fractures or bone lesions. Paraspinal and other soft tissues: No significant findings. Disc levels: No lumbar disc protrusions, spinal or foraminal stenosis. IMPRESSION: 1. Mild left convex lumbar scoliosis. 2. No acute bony findings or lumbar disc protrusions, spinal or foraminal stenosis. Electronically Signed   By: Rudie Meyer M.D.   On: 10/15/2020 18:51   US Renal  Result Date: 10/15/2020 CLINICAL DATA:  Acute renal failure. EXAM: RENAL / URINARY TRACT ULTRASOUND COMPLETE COMPARISON:  None. FINDINGS: Right Kidney: Renal measurements: 11.3 x 4.5 x 5.7 cm = volume: 151 mL. Echogenicity  appears mildly increased. No mass or hydronephrosis visualized. Left Kidney: Renal measurements: 12.0 x 5.4 x 4.8 cm = volume: 164 mL. Echogenicity appears mildly increased. No mass or hydronephrosis visualized. Bladder: Appears normal for degree of bladder distention. Other: Incidentally noted moderate amount of sludge in the gallbladder. No gallbladder wall thickening. IMPRESSION: 1. Mild increase in echogenicity of the bilateral kidneys as can be seen in medical renal disease. No other acute finding. 2. Incidentally noted moderate amount of sludge in the gallbladder. No gallbladder wall thickening. Electronically Signed   By: Emmaline Kluver M.D.   On: 10/15/2020 14:46   IR Fluoro Guide CV Line Right  Result Date:  10/20/2020 INDICATION: 34 year old male referred for temporary hemodialysis catheter placement for treatment of rhabdomyolysis EXAM: IMAGE GUIDED TEMPORARY HEMODIALYSIS CATHETER PLACEMENT MEDICATIONS: None ANESTHESIA/SEDATION: None FLUOROSCOPY TIME:  Fluoroscopy Time: 0 minutes 6 seconds (1 mGy). COMPLICATIONS: None PROCEDURE: Informed written consent was obtained from the patient and the patient's family after a thorough discussion of the procedural risks, benefits and alternatives. All questions were addressed. A timeout was performed prior to the initiation of the procedure. The right neck and chest was prepped with chlorhexidine, and draped in the usual sterile fashion using maximum barrier technique (cap and mask, sterile gown, sterile gloves, large sterile sheet, hand hygiene and cutaneous antiseptic). Local anesthesia was attained by infiltration with 1% lidocaine without epinephrine. Ultrasound demonstrated patency of the right internal jugular vein, and this was documented with an image. Under real-time ultrasound guidance, this vein was accessed with a 21 gauge micropuncture needle and image documentation was performed. A small dermatotomy was made at the access site with an 11 scalpel. A 0.018" wire was advanced into the SVC and the access needle exchanged for a 66F micropuncture vascular sheath. The 0.018" wire was then removed and a 0.035" wire advanced into the IVC. We elected to place a 16 cm temp catheter. Skin and subcutaneous tissues were serially dilated. Catheter was placed on the wire. The catheter tip is positioned in the upper right atrium. This was documented with a spot image. Both ports of the hemodialysis catheter were then tested for excellent function. The ports were then locked with heparinized lock. Patient tolerated the procedure well and remained hemodynamically stable throughout. No complications were encountered and no significant blood loss was encountered. IMPRESSION: Status post  image guided placement of 16 cm non tunneled hemodialysis catheter. Signed, Yvone Neu. Reyne Dumas, RPVI [facility] Electronically Signed   By: Gilmer Mor D.O.   On: 10/20/2020 16:39   IR US Guide Vasc Access Right  Result Date: 10/20/2020 INDICATION: 34 year old male referred for temporary hemodialysis catheter placement for treatment of rhabdomyolysis EXAM: IMAGE GUIDED TEMPORARY HEMODIALYSIS CATHETER PLACEMENT MEDICATIONS: None ANESTHESIA/SEDATION: None FLUOROSCOPY TIME:  Fluoroscopy Time: 0 minutes 6 seconds (1 mGy). COMPLICATIONS: None PROCEDURE: Informed written consent was obtained from the patient and the patient's family after a thorough discussion of the procedural risks, benefits and alternatives. All questions were addressed. A timeout was performed prior to the initiation of the procedure. The right neck and chest was prepped with chlorhexidine, and draped in the usual sterile fashion using maximum barrier technique (cap and mask, sterile gown, sterile gloves, large sterile sheet, hand hygiene and cutaneous antiseptic). Local anesthesia was attained by infiltration with 1% lidocaine without epinephrine. Ultrasound demonstrated patency of the right internal jugular vein, and this was documented with an image. Under real-time ultrasound guidance, this vein was accessed with a  21 gauge micropuncture needle and image documentation was performed. A small dermatotomy was made at the access site with an 11 scalpel. A 0.018" wire was advanced into the SVC and the access needle exchanged for a 43F micropuncture vascular sheath. The 0.018" wire was then removed and a 0.035" wire advanced into the IVC. We elected to place a 16 cm temp catheter. Skin and subcutaneous tissues were serially dilated. Catheter was placed on the wire. The catheter tip is positioned in the upper right atrium. This was documented with a spot image. Both ports of the  hemodialysis catheter were then tested for excellent function. The ports were then locked with heparinized lock. Patient tolerated the procedure well and remained hemodynamically stable throughout. No complications were encountered and no significant blood loss was encountered. IMPRESSION: Status post image guided placement of 16 cm non tunneled hemodialysis catheter. Signed, Yvone Neu. Reyne Dumas, RPVI [facility] Electronically Signed   By: Gilmer Mor D.O.   On: 10/20/2020 16:39   DG CHEST PORT 1 VIEW  Result Date: 10/28/2020 CLINICAL DATA:  Fevers EXAM: PORTABLE CHEST 1 VIEW COMPARISON:  10/20/2020 FINDINGS: Cardiac shadow is within normal limits. The lungs are well aerated bilaterally. Some left retrocardiac atelectasis is noted. No bony abnormality is seen. IMPRESSION: Increased airspace opacity in the left retrocardiac region. Electronically Signed   By: Alcide Clever M.D.   On: 10/28/2020 14:51   DG CHEST PORT 1 VIEW  Result Date: 10/16/2020 CLINICAL DATA:  Shortness of breath EXAM: PORTABLE CHEST 1 VIEW COMPARISON:  Radiograph 02/07/2020 FINDINGS: No consolidation, features of edema, pneumothorax, or effusion. Pulmonary vascularity is normally distributed. The cardiomediastinal contours are unremarkable. No acute osseous or soft tissue abnormality. IMPRESSION: No acute cardiopulmonary abnormality. Electronically Signed   By: Kreg Shropshire M.D.   On: 10/16/2020 21:31   US SCROTUM W/DOPPLER  Result Date: 10/28/2020 CLINICAL DATA:  34 year old male with testicular pain x3 days. EXAM: SCROTAL ULTRASOUND DOPPLER ULTRASOUND OF THE TESTICLES TECHNIQUE: Complete ultrasound examination of the testicles, epididymis, and other scrotal structures was performed. Color and spectral Doppler ultrasound were also utilized to evaluate blood flow to the testicles. COMPARISON:  None. FINDINGS: Right testicle Measurements: 4.2 x 2.0 x 2.8 cm. No mass or  microlithiasis visualized. Left testicle Measurements: 4.0 x 2.2 x 2.4 cm. No mass or microlithiasis visualized. Right epididymis:  Normal in size and appearance. Left epididymis:  Normal in size and appearance. Hydrocele:  Small bilateral hydroceles. Varicocele:  None visualized. Pulsed Doppler interrogation of both testes demonstrates normal low resistance arterial and venous waveforms bilaterally. IMPRESSION: Small bilateral hydroceles, otherwise unremarkable testicular ultrasound. Electronically Signed   By: Elgie Collard M.D.   On: 10/28/2020 16:41   VAS [place] LOWER EXTREMITY VENOUS (DVT)  Result Date: 10/21/2020  Lower Venous DVT Study Indications: Edema.  Risk Factors: None identified. Comparison Study: No prior studies. Performing Technologist: Chanda Busing RVT  Examination Guidelines: A complete evaluation includes B-mode imaging, spectral Doppler, color Doppler, and power Doppler as needed of all accessible portions of each vessel. Bilateral testing is considered an integral part of a complete examination. Limited examinations for reoccurring indications may be performed as noted. The reflux portion of the exam is performed with the patient in reverse Trendelenburg.  +---------+---------------+---------+-----------+----------+--------------+ RIGHT    CompressibilityPhasicitySpontaneityPropertiesThrombus Aging +---------+---------------+---------+-----------+----------+--------------+ CFV      Full           Yes      Yes                                 +---------+---------------+---------+-----------+----------+--------------+  SFJ      Full                                                        +---------+---------------+---------+-----------+----------+--------------+ FV Prox  Full                                                        +---------+---------------+---------+-----------+----------+--------------+ FV Mid   Full                                                         +---------+---------------+---------+-----------+----------+--------------+ FV DistalFull                                                        +---------+---------------+---------+-----------+----------+--------------+ PFV      Full                                                        +---------+---------------+---------+-----------+----------+--------------+ POP      Full           Yes      Yes                                 +---------+---------------+---------+-----------+----------+--------------+ PTV      Full                                                        +---------+---------------+---------+-----------+----------+--------------+ PERO     Full                                                        +---------+---------------+---------+-----------+----------+--------------+   +---------+---------------+---------+-----------+----------+--------------+ LEFT     CompressibilityPhasicitySpontaneityPropertiesThrombus Aging +---------+---------------+---------+-----------+----------+--------------+ CFV      Full           Yes      Yes                                 +---------+---------------+---------+-----------+----------+--------------+ SFJ      Full                                                        +---------+---------------+---------+-----------+----------+--------------+  FV Prox  Full                                                        +---------+---------------+---------+-----------+----------+--------------+ FV Mid   Full                                                        +---------+---------------+---------+-----------+----------+--------------+ FV DistalFull                                                        +---------+---------------+---------+-----------+----------+--------------+ PFV      Full                                                         +---------+---------------+---------+-----------+----------+--------------+ POP      Full           Yes      Yes                                 +---------+---------------+---------+-----------+----------+--------------+ PTV      Full                                                        +---------+---------------+---------+-----------+----------+--------------+ PERO     Full                                                        +---------+---------------+---------+-----------+----------+--------------+     Summary: RIGHT: - There is no evidence of deep vein thrombosis in the lower extremity.  - No cystic structure found in the popliteal fossa.  LEFT: - There is no evidence of deep vein thrombosis in the lower extremity.  - No cystic structure found in the popliteal fossa.  *See table(s) above for measurements and observations. Electronically signed by Sherald Hesshristopher Clark MD on 10/21/2020 at 5:03:41 PM.    Final       Subjective: Feels better.  Ambulating.  Right lower extremity and scrotal edema much improved  Discharge Exam: Vitals:   10/31/20 0515 10/31/20 1022  BP: 114/71 114/73  Pulse: (!) 59 67  Resp: 18   Temp: 98.9 F (37.2 C) 99 F (37.2 C)  SpO2: 96% 98%   Vitals:   10/30/20 2134 10/31/20 0500 10/31/20 0515 10/31/20 1022  BP: 110/69  114/71 114/73  Pulse: 63  (!) 59 67  Resp: 18  18   Temp: 99.1 F (37.3 C)  98.9 F (  37.2 C) 99 F (37.2 C)  TempSrc: Oral  Oral Oral  SpO2: 97%  96% 98%  Weight: 68.3 kg 68.3 kg    Height:        General: Pt is alert, awake, not in acute distress Cardiovascular: RRR, S1/S2 +, no rubs, no gallops Respiratory: CTA bilaterally, no wheezing, no rhonchi Abdominal: Soft, NT, ND, bowel sounds + Extremities: no edema, no cyanosis    The results of significant diagnostics from this hospitalization (including imaging, microbiology, ancillary and laboratory) are listed below for reference.     Microbiology: Recent  Results (from the past 240 hour(s))  Culture, blood (routine x 2)     Status: None (Preliminary result)   Collection Time: 10/28/20  2:19 PM   Specimen: BLOOD  Result Value Ref Range Status   Specimen Description BLOOD LEFT ANTECUBITAL  Final   Special Requests   Final    BOTTLES DRAWN AEROBIC AND ANAEROBIC Blood Culture adequate volume   Culture   Final    NO GROWTH 3 DAYS Performed at [facility], [address], [place], [place] [address]    Report Status PENDING  Incomplete  Culture, blood (routine x 2)     Status: None (Preliminary result)   Collection Time: 10/28/20  2:25 PM   Specimen: BLOOD  Result Value Ref Range Status   Specimen Description BLOOD RIGHT ANTECUBITAL  Final   Special Requests   Final    BOTTLES DRAWN AEROBIC AND ANAEROBIC Blood Culture adequate volume   Culture   Final    NO GROWTH 3 DAYS Performed at [facility], [address] [place], [place] [address]    Report Status PENDING  Incomplete  Culture, Urine     Status: None   Collection Time: 10/28/20  3:29 PM   Specimen: Urine, Random  Result Value Ref Range Status   Specimen Description URINE, RANDOM  Final   Special Requests NONE  Final   Culture   Final    NO GROWTH Performed at [facility], [address], [place], [place] [address]    Report Status 10/29/2020 FINAL  Final     Labs: BNP (last 3 results) No results for input(s): BNP in the last 8760 hours. Basic Metabolic Panel: Recent Labs  Lab 10/27/20 0641 10/28/20 0303 10/29/20 0137 10/30/20 0257 10/31/20 0057  NA 138 140 139 136 139  K 4.4 4.4 4.4 4.6 4.2  CL 103 104 102 100 101  CO2 GLUCOSE 88 81 95 92 92  BUN 57* 49* 42* 37* 37*  CREATININE 3.62* 3.12* 2.90* 2.60* 2.67*  CALCIUM 8.4* 8.7* 8.7* 9.1 9.1  PHOS 5.0* 4.7* 4.3 4.2 4.5   Liver Function Tests: Recent Labs  Lab 10/27/20 0641 10/28/20 0303 10/29/20 0137 10/30/20 0257 10/31/20 0057  ALBUMIN 2.5* 2.6* 2.8* 3.1* 3.2*   No  results for input(s): LIPASE, AMYLASE in the last 168 hours. No results for input(s): AMMONIA in the last 168 hours. CBC: Recent Labs  Lab 10/27/20 0641 10/29/20 0137  WBC 7.8 8.6  HGB 9.0* 8.7*  HCT 25.8* 26.5*  MCV 86.6 88.0  PLT 186 196   Cardiac Enzymes: Recent Labs  Lab 10/27/20 0641  CKTOTAL 390   BNP: Invalid input(s): POCBNP CBG: Recent Labs  Lab 10/30/20 0714 10/30/20 1149 10/30/20 1715 10/30/20 2136 10/31/20 0652  GLUCAP 89 92 124* 84 93   D-Dimer No results for input(s): DDIMER in the last  72 hours. Hgb A1c No results for input(s): HGBA1C in the last 72 hours. Lipid Profile No results for input(s): CHOL, HDL, LDLCALC, TRIG, CHOLHDL, LDLDIRECT in the last 72 hours. Thyroid function studies No results for input(s): TSH, T4TOTAL, T3FREE, THYROIDAB in the last 72 hours.  Invalid input(s): FREET3 Anemia work up No results for input(s): VITAMINB12, FOLATE, FERRITIN, TIBC, IRON, RETICCTPCT in the last 72 hours. Urinalysis    Component Value Date/Time   COLORURINE STRAW (A) 10/28/2020 1537   APPEARANCEUR CLEAR 10/28/2020 1537   LABSPEC 1.008 10/28/2020 1537   PHURINE 7.0 10/28/2020 1537   GLUCOSEU NEGATIVE 10/28/2020 1537   HGBUR MODERATE (A) 10/28/2020 1537   BILIRUBINUR NEGATIVE 10/28/2020 1537   KETONESUR NEGATIVE 10/28/2020 1537   PROTEINUR NEGATIVE 10/28/2020 1537   NITRITE NEGATIVE 10/28/2020 1537   LEUKOCYTESUR NEGATIVE 10/28/2020 1537   Sepsis Labs Invalid input(s): PROCALCITONIN,  WBC,  LACTICIDVEN Microbiology Recent Results (from the past 240 hour(s))  Culture, blood (routine x 2)     Status: None (Preliminary result)   Collection Time: 10/28/20  2:19 PM   Specimen: BLOOD  Result Value Ref Range Status   Specimen Description BLOOD LEFT ANTECUBITAL  Final   Special Requests   Final    BOTTLES DRAWN AEROBIC AND ANAEROBIC Blood Culture adequate volume   Culture   Final    NO GROWTH 3 DAYS Performed at [facility], [address], [place], [place] [address]    Report Status PENDING  Incomplete  Culture, blood (routine x 2)     Status: None (Preliminary result)   Collection Time: 10/28/20  2:25 PM   Specimen: BLOOD  Result Value Ref Range Status   Specimen Description BLOOD RIGHT ANTECUBITAL  Final   Special Requests   Final    BOTTLES DRAWN AEROBIC AND ANAEROBIC Blood Culture adequate volume   Culture   Final    NO GROWTH 3 DAYS Performed at [facility], [address] [place], [place] [address]    Report Status PENDING  Incomplete  Culture, Urine     Status: None   Collection Time: 10/28/20  3:29 PM   Specimen: Urine, Random  Result Value Ref Range Status   Specimen Description URINE, RANDOM  Final   Special Requests NONE  Final   Culture   Final    NO GROWTH Performed at [facility], [address] [place], [place] [address]    Report Status 10/29/2020 FINAL  Final     Time coordinating discharge: Over 30 minutes  SIGNED:   Lynn Ito, MD  [facility] 10/31/2020, 12:51 PM Pager   If 7PM-7AM, please contact night-coverage [email or web address] Password [email or web address]

## 2020-10-31 NOTE — TOC Transition Note (Signed)
Transition of Care [facility]) - CM/SW Discharge Note   Patient Details  Name: Antino Mayabb MRN: [id number] Date of Birth: [birth year]  [facility] of [facility] [place] CM/SW Contact:  Bess Kinds, RN Phone Number: [phone] 10/31/2020, 1:35 PM   Clinical Narrative:     Spoke with patient at the bedside to discuss transition home. Discussed need for transportation. Patient stated that he will need a ride to the [address], [place], [place] [address]. Rider waiver signed and faxed to transportation. Verified patient cell phone number. Discussed patient scheduling follow up appointment with Dr. Alton Revere - patient verbalized understanding. No further TOC needs identified.   Final next level of care: Home/Self Care Barriers to Discharge: No Barriers Identified   Patient Goals and CMS Choice Patient states their goals for this hospitalization and ongoing recovery are:: discharge to dad's home in [facility].gov Compare Post Acute Care list provided to:: Patient Choice offered to / list presented to : NA  Discharge Placement                       Discharge Plan and Services In-house Referral: Clinical Social Work Discharge Planning Services: CM Consult Post Acute Care Choice: NA          DME Arranged: N/A DME Agency: NA       HH Arranged: NA HH Agency: NA        Social Determinants of Health (SDOH) Interventions     Readmission Risk Interventions No flowsheet data found.

## 2020-10-31 NOTE — Progress Notes (Signed)
RN wasted of Fentanyl and witnessed by Bernie Covey, RN.  [place], RN

## 2020-11-02 LAB — CULTURE, BLOOD (ROUTINE X 2)
Culture: NO GROWTH
Culture: NO GROWTH
Special Requests: ADEQUATE
Special Requests: ADEQUATE

## 2022-09-08 IMAGING — DX DG CHEST 2V
2 series · 2 of 2 positions shown · non-contrast
Comparison: 10/16/2020

CLINICAL DATA: Numbness right leg.

EXAM:
CHEST - 2 VIEW

[chest ap]
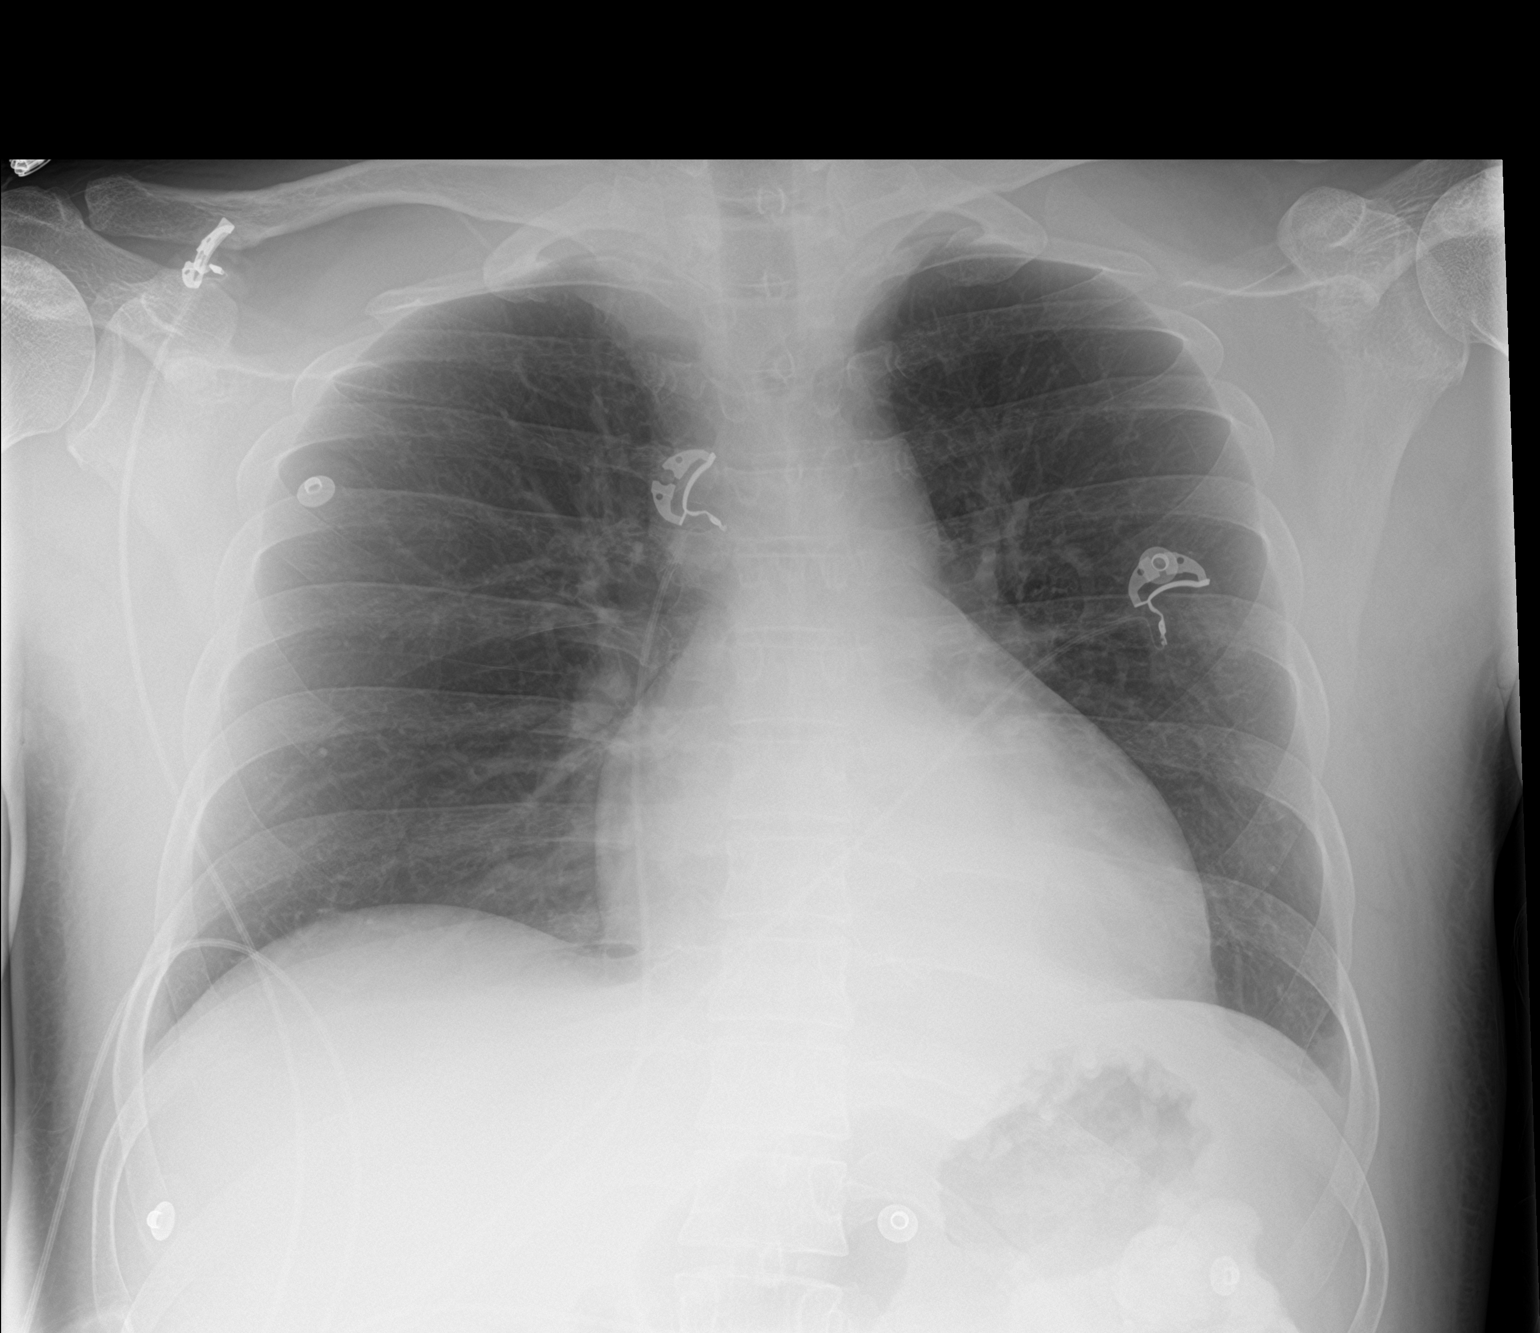

[chest lat]
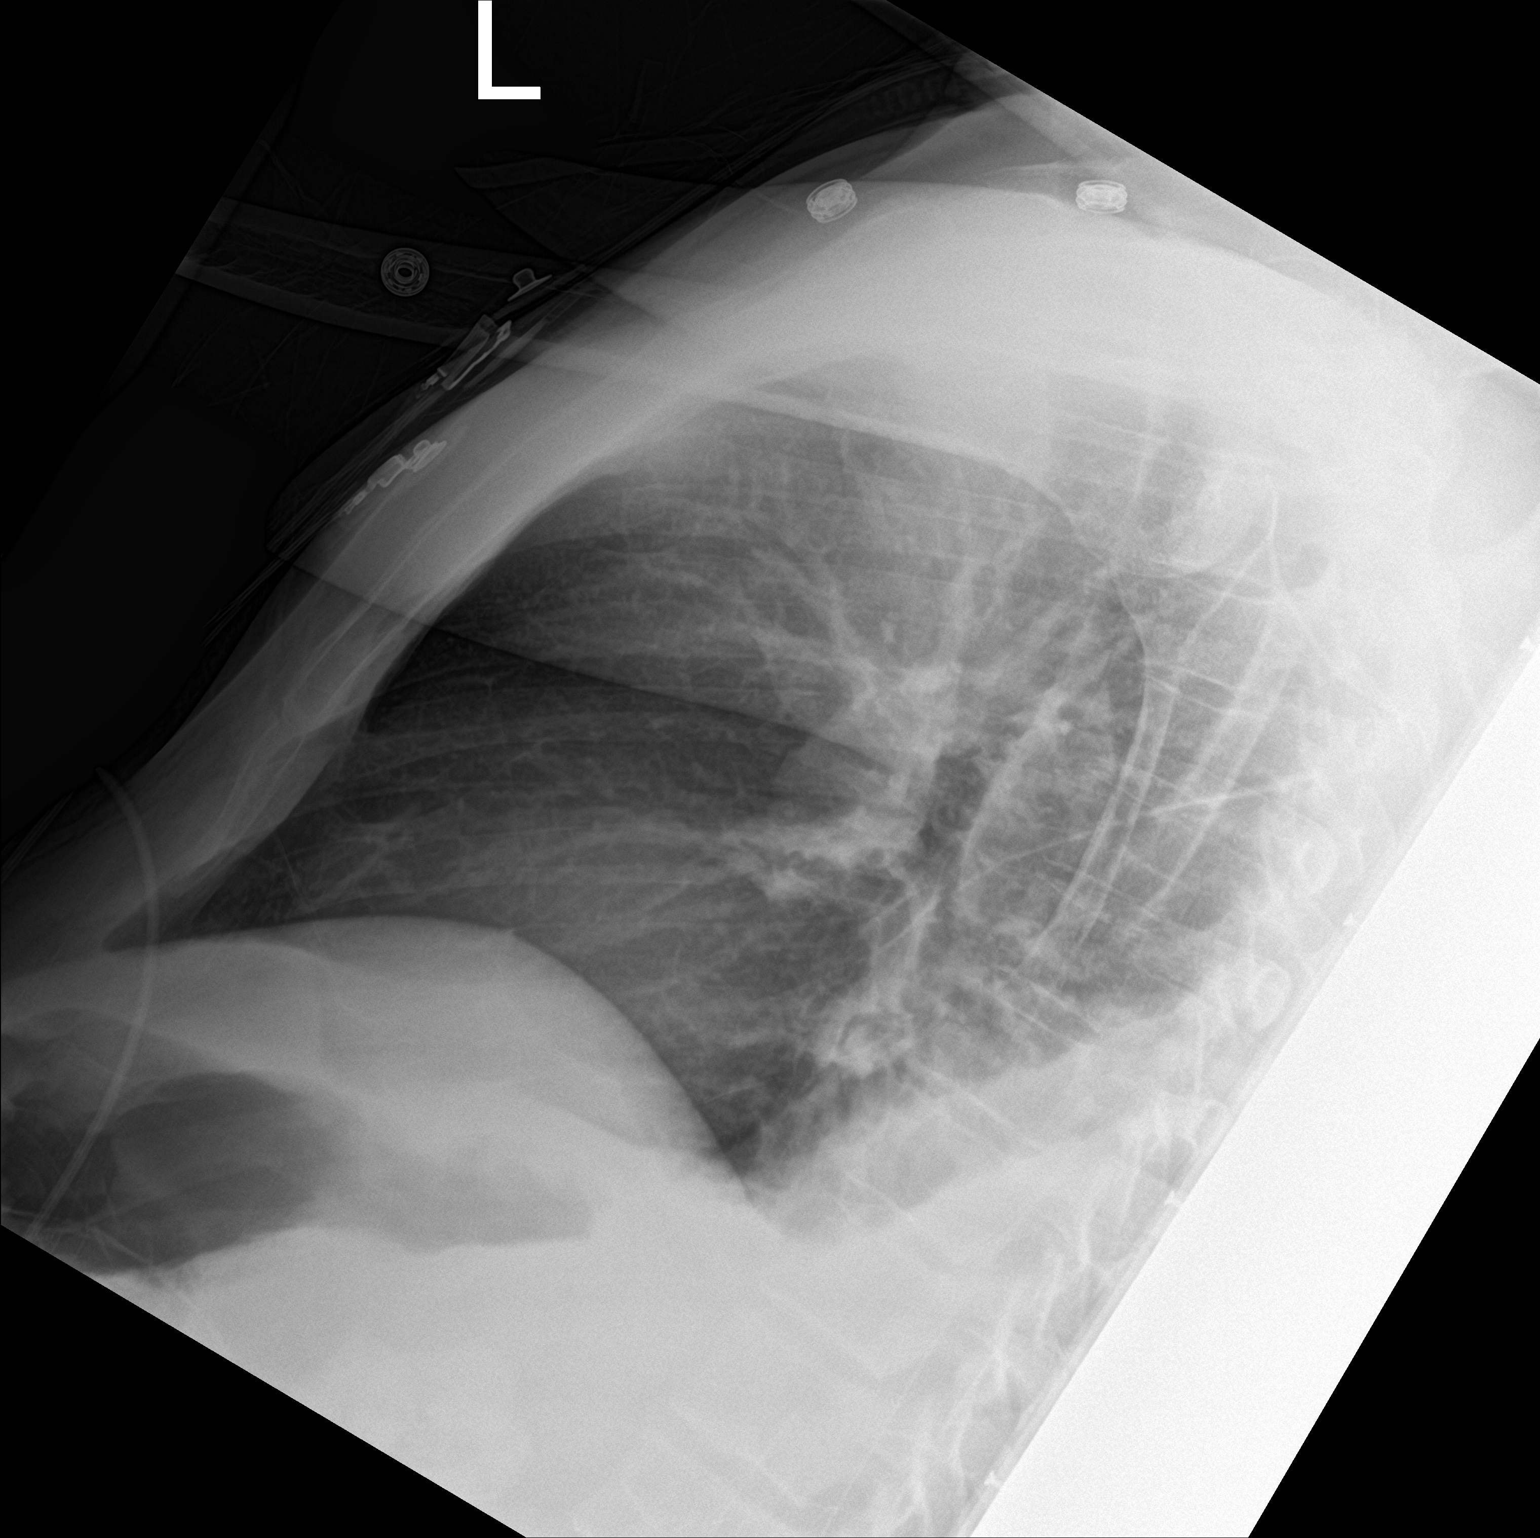

[2 of 2 positions shown; findings below may reference images not displayed]

FINDINGS: Heart size upper normal.  Vascularity normal.

Left lower lobe airspace density has developed since the prior study
possibly atelectasis. Small bilateral pleural effusions. Negative
for pulmonary edema.
IMPRESSION: Small bilateral pleural effusions.  Negative for heart failure.

Left lower lobe airspace disease likely atelectasis or pneumonia.
